# Patient Record
Sex: Female | Born: 1970 | Race: White | Hispanic: No | Smoking: Never smoker
Health system: Southern US, Community
[De-identification: ages and names within clinical notes are randomized; demographics above are authoritative.]

## PROBLEM LIST (undated history)

## (undated) DIAGNOSIS — J45909 Unspecified asthma, uncomplicated: Secondary | ICD-10-CM

## (undated) DIAGNOSIS — K589 Irritable bowel syndrome without diarrhea: Secondary | ICD-10-CM

## (undated) DIAGNOSIS — N83209 Unspecified ovarian cyst, unspecified side: Secondary | ICD-10-CM

## (undated) DIAGNOSIS — F419 Anxiety disorder, unspecified: Secondary | ICD-10-CM

## (undated) HISTORY — PX: CHOLECYSTECTOMY: SHX55

## (undated) HISTORY — PX: ERCP: SHX60

## (undated) HISTORY — PX: TUBAL LIGATION: SHX77

## (undated) HISTORY — PX: OVARIAN CYST REMOVAL: SHX89

---

## 2010-02-25 ENCOUNTER — Emergency Department (HOSPITAL_BASED_OUTPATIENT_CLINIC_OR_DEPARTMENT_OTHER): Admission: EM | Admit: 2010-02-25 | Discharge: 2010-02-25 | Payer: Self-pay | Admitting: Emergency Medicine

## 2010-02-25 ENCOUNTER — Ambulatory Visit: Payer: Self-pay | Admitting: Diagnostic Radiology

## 2012-01-18 ENCOUNTER — Emergency Department (INDEPENDENT_AMBULATORY_CARE_PROVIDER_SITE_OTHER): Payer: Self-pay

## 2012-01-18 ENCOUNTER — Encounter (HOSPITAL_BASED_OUTPATIENT_CLINIC_OR_DEPARTMENT_OTHER): Payer: Self-pay | Admitting: *Deleted

## 2012-01-18 ENCOUNTER — Other Ambulatory Visit: Payer: Self-pay

## 2012-01-18 ENCOUNTER — Emergency Department (HOSPITAL_BASED_OUTPATIENT_CLINIC_OR_DEPARTMENT_OTHER)
Admission: EM | Admit: 2012-01-18 | Discharge: 2012-01-19 | Disposition: A | Discharge status: Home / Self Care | Payer: Self-pay | Attending: Emergency Medicine | Admitting: Emergency Medicine

## 2012-01-18 DIAGNOSIS — R109 Unspecified abdominal pain: Secondary | ICD-10-CM | POA: Insufficient documentation

## 2012-01-18 DIAGNOSIS — R079 Chest pain, unspecified: Secondary | ICD-10-CM

## 2012-01-18 DIAGNOSIS — R209 Unspecified disturbances of skin sensation: Secondary | ICD-10-CM

## 2012-01-18 DIAGNOSIS — R748 Abnormal levels of other serum enzymes: Secondary | ICD-10-CM

## 2012-01-18 DIAGNOSIS — R7989 Other specified abnormal findings of blood chemistry: Secondary | ICD-10-CM | POA: Insufficient documentation

## 2012-01-18 DIAGNOSIS — R112 Nausea with vomiting, unspecified: Secondary | ICD-10-CM | POA: Insufficient documentation

## 2012-01-18 DIAGNOSIS — M549 Dorsalgia, unspecified: Secondary | ICD-10-CM

## 2012-01-18 DIAGNOSIS — R1110 Vomiting, unspecified: Secondary | ICD-10-CM

## 2012-01-18 DIAGNOSIS — R0602 Shortness of breath: Secondary | ICD-10-CM | POA: Insufficient documentation

## 2012-01-18 LAB — URINALYSIS, ROUTINE W REFLEX MICROSCOPIC
Hgb urine dipstick: NEGATIVE
Ketones, ur: 15 mg/dL — AB
Leukocytes, UA: NEGATIVE
Protein, ur: NEGATIVE mg/dL
Specific Gravity, Urine: 1.024 (ref 1.005–1.030)
Urobilinogen, UA: 0.2 mg/dL (ref 0.0–1.0)

## 2012-01-18 LAB — CBC
MCHC: 33.3 g/dL (ref 30.0–36.0)
MCV: 83.6 fL (ref 78.0–100.0)
Platelets: 226 10*3/uL (ref 150–400)
RBC: 5.24 MIL/uL — ABNORMAL HIGH (ref 3.87–5.11)
RDW: 13 % (ref 11.5–15.5)
WBC: 15.5 10*3/uL — ABNORMAL HIGH (ref 4.0–10.5)

## 2012-01-18 LAB — DIFFERENTIAL
Basophils Absolute: 0 10*3/uL (ref 0.0–0.1)
Basophils Relative: 0 % (ref 0–1)
Eosinophils Absolute: 0 10*3/uL (ref 0.0–0.7)
Eosinophils Relative: 0 % (ref 0–5)
Neutrophils Relative %: 94 % — ABNORMAL HIGH (ref 43–77)

## 2012-01-18 LAB — COMPREHENSIVE METABOLIC PANEL
ALT: 251 U/L — ABNORMAL HIGH (ref 0–35)
AST: 412 U/L — ABNORMAL HIGH (ref 0–37)
CO2: 24 mEq/L (ref 19–32)
Calcium: 9.2 mg/dL (ref 8.4–10.5)
Chloride: 103 mEq/L (ref 96–112)
GFR calc Af Amer: >90 mL/min (ref >90)
GFR calc non Af Amer: >90 mL/min (ref >90)
Potassium: 3.8 mEq/L (ref 3.5–5.1)
Total Bilirubin: 0.8 mg/dL (ref 0.3–1.2)

## 2012-01-18 LAB — CARDIAC PANEL(CRET KIN+CKTOT+MB+TROPI): Relative Index: INVALID (ref 0.0–2.5)

## 2012-01-18 LAB — LIPASE, BLOOD: Lipase: 25 U/L (ref 11–59)

## 2012-01-18 MED ORDER — SODIUM CHLORIDE 0.9 % IV BOLUS (SEPSIS)
1000.0000 mL | Freq: Once | INTRAVENOUS | Status: AC
  Administered 2012-01-18: 1000 mL via INTRAVENOUS

## 2012-01-18 MED ORDER — ONDANSETRON HCL 4 MG/2ML IJ SOLN
4.0000 mg | Freq: Once | INTRAMUSCULAR | Status: AC
  Administered 2012-01-18: 4 mg via INTRAVENOUS
  Filled 2012-01-18: qty 2

## 2012-01-18 MED ORDER — MORPHINE SULFATE 4 MG/ML IJ SOLN
4.0000 mg | Freq: Once | INTRAMUSCULAR | Status: AC
  Administered 2012-01-18: 4 mg via INTRAVENOUS
  Filled 2012-01-18: qty 1

## 2012-01-18 NOTE — ED Notes (Signed)
Pt presents to ED today with cold sx.  Pt reports vomitting x1 about 1 hour ago with associated SHOB and numbness

## 2012-01-18 NOTE — ED Notes (Signed)
Family at bedside. No active vomiting.

## 2012-01-18 NOTE — ED Provider Notes (Signed)
History     CSN: [id number]  Arrival date & time 01/18/12  2108   First MD Initiated Contact with Patient 01/18/12 2202      Chief Complaint  Patient presents with  . Emesis  . URI   patient was recently out of town in [place] , visiting her grandmother in the hospital.. Today after work she began vomiting acutely. States she vomited 3 times. She's also had some cold and URI symptoms. She's had no diarrhea. Shortly after the vomiting. She began to feel diffuse epigastric pain and shortness of breath, especially when she tried to breathe deeper moved. She states the epigastric pain is worse especially if she tries to move around at all. The vomiting has subsided. She also states there was some "numbness" in her arms earlier today. Apparently, has no significant past medical history other than childhood asthma.  Denies any fever or any urinary symptoms. She has had some back pain.  (Consider location/radiation/quality/duration/timing/severity/associated sxs/prior treatment) HPI  History reviewed. No pertinent past medical history.  History reviewed. No pertinent past surgical history.  History reviewed. No pertinent family history.  History  Substance Use Topics  . Smoking status: Not on file  . Smokeless tobacco: Not on file  . Alcohol Use:     OB History    Grav Para Term Preterm Abortions TAB SAB Ect Mult Living                  Review of Systems  All other systems reviewed and are negative.    Allergies  Review of patient's allergies indicates no known allergies.  Home Medications   Current Outpatient Rx  Name Route Sig Dispense Refill  . PRESCRIPTION MEDICATION Oral Take 1 tablet by mouth 3 (three) times daily. Tone ([employer] Vitamin)      BP 128/80  Pulse 88  Temp(Src) 98.3 F (36.8 C) (Oral)  Resp 20  SpO2 99%  Physical Exam  Nursing note and vitals reviewed. Constitutional: She is oriented to person, place, and time. She appears well-developed and  well-nourished.  HENT:  Head: Normocephalic and atraumatic.  Eyes: Conjunctivae and EOM are normal. Pupils are equal, round, and reactive to light.  Neck: Neck supple.  Cardiovascular: Normal rate and regular rhythm.  Exam reveals no gallop and no friction rub.   No murmur heard. Pulmonary/Chest: Effort normal and breath sounds normal. No respiratory distress. She has no wheezes. She has no rales. She exhibits no tenderness.  Abdominal: Soft. Bowel sounds are normal. She exhibits no distension. There is no tenderness. There is no rebound and no guarding.       Very minimal epigastric tenderness. Otherwise, negative.  Musculoskeletal: Normal range of motion.  Neurological: She is alert and oriented to person, place, and time. No cranial nerve deficit. Coordination normal.  Skin: Skin is warm and dry. No rash noted.  Psychiatric: She has a normal mood and affect.    ED Course  Procedures (including critical care time)   Labs Reviewed  CARDIAC PANEL(CRET KIN+CKTOT+MB+TROPI)  CBC  DIFFERENTIAL  COMPREHENSIVE METABOLIC PANEL  LIPASE, BLOOD  PREGNANCY, URINE  URINALYSIS, ROUTINE W REFLEX MICROSCOPIC   No results found.   No diagnosis found.    MDM  Pt is seen and examined;  Initial history and physical completed.  Will follow.        Results for orders placed during the hospital encounter of 01/18/12  CARDIAC PANEL(CRET KIN+CKTOT+MB+TROPI)      Component Value Range  Total CK 68  7 - 177 (U/L)   CK, MB 1.6  0.3 - 4.0 (ng/mL)   Troponin I <0.30  <0.30 (ng/mL)   Relative Index RELATIVE INDEX IS INVALID  0.0 - 2.5   CBC      Component Value Range   WBC 15.5 (*) 4.0 - 10.5 (K/uL)   RBC 5.24 (*) 3.87 - 5.11 (MIL/uL)   Hemoglobin 14.6  12.0 - 15.0 (g/dL)   HCT 40.9  81.1 - 91.4 (%)   MCV 83.6  78.0 - 100.0 (fL)   MCH 27.9  26.0 - 34.0 (pg)   MCHC 33.3  30.0 - 36.0 (g/dL)   RDW 78.2  95.6 - 21.3 (%)   Platelets 226  150 - 400 (K/uL)  DIFFERENTIAL      Component  Value Range   Neutrophils Relative 94 (*) 43 - 77 (%)   Neutro Abs 14.5 (*) 1.7 - 7.7 (K/uL)   Lymphocytes Relative 3 (*) 12 - 46 (%)   Lymphs Abs 0.5 (*) 0.7 - 4.0 (K/uL)   Monocytes Relative 3  3 - 12 (%)   Monocytes Absolute 0.5  0.1 - 1.0 (K/uL)   Eosinophils Relative 0  0 - 5 (%)   Eosinophils Absolute 0.0  0.0 - 0.7 (K/uL)   Basophils Relative 0  0 - 1 (%)   Basophils Absolute 0.0  0.0 - 0.1 (K/uL)  COMPREHENSIVE METABOLIC PANEL      Component Value Range   Sodium 138  135 - 145 (mEq/L)   Potassium 3.8  3.5 - 5.1 (mEq/L)   Chloride 103  96 - 112 (mEq/L)   CO2 24  19 - 32 (mEq/L)   Glucose, Bld 137 (*) 70 - 99 (mg/dL)   BUN 16  6 - 23 (mg/dL)   Creatinine, Ser 0.86  0.50 - 1.10 (mg/dL)   Calcium 9.2  8.4 - 57.8 (mg/dL)   Total Protein 7.4  6.0 - 8.3 (g/dL)   Albumin 4.0  3.5 - 5.2 (g/dL)   AST 469 (*) 0 - 37 (U/L)   ALT 251 (*) 0 - 35 (U/L)   Alkaline Phosphatase 75  39 - 117 (U/L)   Total Bilirubin 0.8  0.3 - 1.2 (mg/dL)   GFR calc non Af Amer >90  >90 (mL/min)   GFR calc Af Amer >90  >90 (mL/min)  LIPASE, BLOOD      Component Value Range   Lipase 25  11 - 59 (U/L)  PREGNANCY, URINE      Component Value Range   Preg Test, Ur NEGATIVE    URINALYSIS, ROUTINE W REFLEX MICROSCOPIC      Component Value Range   Color, Urine YELLOW  YELLOW    APPearance CLOUDY (*) CLEAR    Specific Gravity, Urine 1.024  1.005 - 1.030    pH 7.5  5.0 - 8.0    Glucose, UA NEGATIVE  NEGATIVE (mg/dL)   Hgb urine dipstick NEGATIVE  NEGATIVE    Bilirubin Urine NEGATIVE  NEGATIVE    Ketones, ur 15 (*) NEGATIVE (mg/dL)   Protein, ur NEGATIVE  NEGATIVE (mg/dL)   Urobilinogen, UA 0.2  0.0 - 1.0 (mg/dL)   Nitrite NEGATIVE  NEGATIVE    Leukocytes, UA NEGATIVE  NEGATIVE    Dg Chest 2 View  01/18/2012  *RADIOLOGY REPORT*  Clinical Data: Shortness of breath, bilateral chest and back pain, bilateral hand numbness and vomiting.  CHEST - 2 VIEW  Comparison: None.  Findings: The lungs are well-aerated  and clear.  There is no evidence of focal opacification, pleural effusion or pneumothorax.  The heart is normal in size; the mediastinal contour is within normal limits.  No acute osseous abnormalities are seen.  Clips are noted within the right upper quadrant, reflecting prior cholecystectomy.  IMPRESSION: No acute cardiopulmonary process seen.  Original Report Authenticated By: Tonia Ghent, M.D.     Date: 01/18/2012  Rate: 77  Rhythm: normal sinus rhythm  QRS Axis: normal  Intervals: normal  ST/T Wave abnormalities: normal  Conduction Disutrbances:Incomplete RBBB  Narrative Interpretation:   Old EKG Reviewed: none available  11:58 PM Patient is reassessed in the room. Lab studies, discussed, including elevated, AST and ALT. Patient, states she's had a cholecystectomy done years ago. Her lipase is normal. Urine showed ketones, but negative for nitrites or leukocytes. A slightly elevated white blood cell count noted. Chest x-ray is normal. EKG, documented above. No significant changes or abnormalities noted.   Plan will be to obtain a CT scan to further evaluate the persistent epigastric pain and elevated liver function tests. The overnight physician will follow care of the patient                        Pending appropriate disposition. Remains stable upon discharge.    Dymphna Wadley A. Patrica Duel, MD 01/18/12 2359

## 2012-01-19 ENCOUNTER — Emergency Department (INDEPENDENT_AMBULATORY_CARE_PROVIDER_SITE_OTHER): Payer: Self-pay

## 2012-01-19 DIAGNOSIS — R112 Nausea with vomiting, unspecified: Secondary | ICD-10-CM

## 2012-01-19 DIAGNOSIS — R7989 Other specified abnormal findings of blood chemistry: Secondary | ICD-10-CM

## 2012-01-19 DIAGNOSIS — K5730 Diverticulosis of large intestine without perforation or abscess without bleeding: Secondary | ICD-10-CM

## 2012-01-19 DIAGNOSIS — R109 Unspecified abdominal pain: Secondary | ICD-10-CM

## 2012-01-19 MED ORDER — OXYCODONE-ACETAMINOPHEN 5-325 MG PO TABS: 1.0000 | ORAL_TABLET | ORAL | Status: AC | PRN

## 2012-01-19 MED ORDER — OXYCODONE-ACETAMINOPHEN 5-325 MG PO TABS
1.0000 | ORAL_TABLET | Freq: Once | ORAL | Status: AC
  Administered 2012-01-19: 1 via ORAL
  Filled 2012-01-19: qty 1

## 2012-01-19 MED ORDER — ONDANSETRON HCL 4 MG/2ML IJ SOLN
4.0000 mg | Freq: Once | INTRAMUSCULAR | Status: AC
  Administered 2012-01-19: 4 mg via INTRAVENOUS
  Filled 2012-01-19: qty 2

## 2012-01-19 MED ORDER — ONDANSETRON 4 MG PO TBDP: 4.0000 mg | ORAL_TABLET | Freq: Three times a day (TID) | ORAL | Status: AC | PRN

## 2012-01-19 NOTE — ED Provider Notes (Signed)
Results for orders placed during the hospital encounter of 01/18/12  CARDIAC PANEL(CRET KIN+CKTOT+MB+TROPI)      Component Value Range   Total CK 68  7 - 177 (U/L)   CK, MB 1.6  0.3 - 4.0 (ng/mL)   Troponin I <0.30  <0.30 (ng/mL)   Relative Index RELATIVE INDEX IS INVALID  0.0 - 2.5   CBC      Component Value Range   WBC 15.5 (*) 4.0 - 10.5 (K/uL)   RBC 5.24 (*) 3.87 - 5.11 (MIL/uL)   Hemoglobin 14.6  12.0 - 15.0 (g/dL)   HCT 40.9  81.1 - 91.4 (%)   MCV 83.6  78.0 - 100.0 (fL)   MCH 27.9  26.0 - 34.0 (pg)   MCHC 33.3  30.0 - 36.0 (g/dL)   RDW 78.2  95.6 - 21.3 (%)   Platelets 226  150 - 400 (K/uL)  DIFFERENTIAL      Component Value Range   Neutrophils Relative 94 (*) 43 - 77 (%)   Neutro Abs 14.5 (*) 1.7 - 7.7 (K/uL)   Lymphocytes Relative 3 (*) 12 - 46 (%)   Lymphs Abs 0.5 (*) 0.7 - 4.0 (K/uL)   Monocytes Relative 3  3 - 12 (%)   Monocytes Absolute 0.5  0.1 - 1.0 (K/uL)   Eosinophils Relative 0  0 - 5 (%)   Eosinophils Absolute 0.0  0.0 - 0.7 (K/uL)   Basophils Relative 0  0 - 1 (%)   Basophils Absolute 0.0  0.0 - 0.1 (K/uL)  COMPREHENSIVE METABOLIC PANEL      Component Value Range   Sodium 138  135 - 145 (mEq/L)   Potassium 3.8  3.5 - 5.1 (mEq/L)   Chloride 103  96 - 112 (mEq/L)   CO2 24  19 - 32 (mEq/L)   Glucose, Bld 137 (*) 70 - 99 (mg/dL)   BUN 16  6 - 23 (mg/dL)   Creatinine, Ser 0.86  0.50 - 1.10 (mg/dL)   Calcium 9.2  8.4 - 57.8 (mg/dL)   Total Protein 7.4  6.0 - 8.3 (g/dL)   Albumin 4.0  3.5 - 5.2 (g/dL)   AST 469 (*) 0 - 37 (U/L)   ALT 251 (*) 0 - 35 (U/L)   Alkaline Phosphatase 75  39 - 117 (U/L)   Total Bilirubin 0.8  0.3 - 1.2 (mg/dL)   GFR calc non Af Amer >90  >90 (mL/min)   GFR calc Af Amer >90  >90 (mL/min)  LIPASE, BLOOD      Component Value Range   Lipase 25  11 - 59 (U/L)  PREGNANCY, URINE      Component Value Range   Preg Test, Ur NEGATIVE    URINALYSIS, ROUTINE W REFLEX MICROSCOPIC      Component Value Range   Color, Urine YELLOW  YELLOW     APPearance CLOUDY (*) CLEAR    Specific Gravity, Urine 1.024  1.005 - 1.030    pH 7.5  5.0 - 8.0    Glucose, UA NEGATIVE  NEGATIVE (mg/dL)   Hgb urine dipstick NEGATIVE  NEGATIVE    Bilirubin Urine NEGATIVE  NEGATIVE    Ketones, ur 15 (*) NEGATIVE (mg/dL)   Protein, ur NEGATIVE  NEGATIVE (mg/dL)   Urobilinogen, UA 0.2  0.0 - 1.0 (mg/dL)   Nitrite NEGATIVE  NEGATIVE    Leukocytes, UA NEGATIVE  NEGATIVE   D-DIMER, QUANTITATIVE      Component Value Range   D-Dimer, Quant 1.05 (*)  0.00 - 0.48 (ug/mL-FEU)   Ct Abdomen Pelvis Wo Contrast  01/19/2012  *RADIOLOGY REPORT*  Clinical Data: Abdominal pain, nausea and vomiting; elevated LFTs.  CT ABDOMEN AND PELVIS WITHOUT CONTRAST  Technique:  Multidetector CT imaging of the abdomen and pelvis was performed following the standard protocol without intravenous contrast.  Comparison: None.  Findings: Minimal bibasilar scarring is noted; the visualized lung bases are otherwise clear.  The liver and spleen are unremarkable in appearance.  The patient is status post cholecystectomy, with clips noted at the gallbladder fossa.  The pancreas and adrenal glands are unremarkable.  The kidneys are unremarkable in appearance.  There is no evidence of hydronephrosis.  No renal or ureteral stones are seen.  No perinephric stranding is appreciated.  No free fluid is identified.  The small bowel is unremarkable in appearance.  The stomach is within normal limits.  No acute vascular abnormalities are seen.  The appendix there is normal in caliber, without evidence for appendicitis.  Scattered diverticulosis is noted along the descending colon and at the proximal transverse colon, without evidence of diverticulitis.  The colon is otherwise unremarkable in appearance.  The bladder is mildly distended; the attenuation of fluid in the bladder is increased.  The uterus is grossly unremarkable in appearance.  The ovaries are relatively symmetric; no suspicious adnexal masses are  seen.  No inguinal lymphadenopathy is seen.  No acute osseous abnormalities are identified.  IMPRESSION:  1.  Increased attenuation of the fluid within the bladder; this is of uncertain etiology, as the patient's urinalysis is not particularly impressive. 2.  Scattered diverticulosis along the descending colon and at the proximal transverse colon, without evidence of diverticulitis.  Original Report Authenticated By: Tonia Ghent, M.D.   Dg Chest 2 View  01/18/2012  *RADIOLOGY REPORT*  Clinical Data: Shortness of breath, bilateral chest and back pain, bilateral hand numbness and vomiting.  CHEST - 2 VIEW  Comparison: None.  Findings: The lungs are well-aerated and clear.  There is no evidence of focal opacification, pleural effusion or pneumothorax.  The heart is normal in size; the mediastinal contour is within normal limits.  No acute osseous abnormalities are seen.  Clips are noted within the right upper quadrant, reflecting prior cholecystectomy.  IMPRESSION: No acute cardiopulmonary process seen.  Original Report Authenticated By: Tonia Ghent, M.D.    Patient was turned over to me by Dr. Patrica Duel pending a CT of her abdomen and pelvis.  The CT of her abdomen pelvis does not show any significant abnormalities to explain the patient's elevated liver tests as well as her upper abdominal pain and nausea and vomiting.  I have gone back and reassessed this patient and she has a soft benign abdomen it does not appear to have an acute process at this time.  Patient has already had a cholecystectomy which makes a gallstone unlikely.  Patient's nausea is now improved and patient notes pain only when she moves certain ways.  She has no difficulty breathing at this time.  Her lungs are clear on exam as well.  I noted that Dr. Patrica Duel did order a d-dimer on this patient with pulmonary embolus seems unlikely given the description of this patient's symptoms.  Patient notes that she only feels short of breath when she  has worsening of her pain and her pain is worsened with movement and is bilateral in her upper abdomen.  Patient has no noted leg swelling.  Notably by [facility] criteria patient would also be negative.  Therefore as the d-dimer is a nonspecific test I do not feel this patient needs further evaluation with a CT angio for evaluation of pulmonary embolus.  Patient's symptoms could potentially be explained by gastritis or hiatal hernia causing irritation of the diaphragm.  Patient has been advised to use her pain medication and nausea medicine that I will send her home with as well as acid reflux medicine to treat her symptoms.  She is also been advised to followup with her primary care physician at [facility] this week for further follow not only for her symptoms but for her elevated liver tests.  Patient does give a history of elevated liver enzymes in the past although I have no prior tests to compare to today's results 2.  It is unclear if the elevated liver enzymes today are part of this acute process.  As patient has had improvement in her symptoms and has a benign exam with normal vital signs I feel this patient can be safely discharged home with close followup with her PCP.  She's also been given strict precautions to return for worsening pain, difficulty breathing persistent vomiting or other concerns and her and her husband both understand this at time of discharge.  Nat Christen, MD 01/19/12 [phone]

## 2012-05-27 ENCOUNTER — Encounter (HOSPITAL_BASED_OUTPATIENT_CLINIC_OR_DEPARTMENT_OTHER): Payer: Self-pay | Admitting: Emergency Medicine

## 2012-05-27 ENCOUNTER — Emergency Department (HOSPITAL_BASED_OUTPATIENT_CLINIC_OR_DEPARTMENT_OTHER)
Admission: EM | Admit: 2012-05-27 | Discharge: 2012-05-27 | Disposition: A | Discharge status: Home / Self Care | Payer: No Typology Code available for payment source | Attending: Emergency Medicine | Admitting: Emergency Medicine

## 2012-05-27 ENCOUNTER — Emergency Department (HOSPITAL_BASED_OUTPATIENT_CLINIC_OR_DEPARTMENT_OTHER): Payer: No Typology Code available for payment source

## 2012-05-27 DIAGNOSIS — S239XXA Sprain of unspecified parts of thorax, initial encounter: Secondary | ICD-10-CM | POA: Insufficient documentation

## 2012-05-27 DIAGNOSIS — R000 Tachycardia, unspecified: Secondary | ICD-10-CM | POA: Insufficient documentation

## 2012-05-27 DIAGNOSIS — S29019A Strain of muscle and tendon of unspecified wall of thorax, initial encounter: Secondary | ICD-10-CM

## 2012-05-27 DIAGNOSIS — S20219A Contusion of unspecified front wall of thorax, initial encounter: Secondary | ICD-10-CM

## 2012-05-27 DIAGNOSIS — S139XXA Sprain of joints and ligaments of unspecified parts of neck, initial encounter: Secondary | ICD-10-CM | POA: Insufficient documentation

## 2012-05-27 DIAGNOSIS — S161XXA Strain of muscle, fascia and tendon at neck level, initial encounter: Secondary | ICD-10-CM

## 2012-05-27 MED ORDER — HYDROCODONE-ACETAMINOPHEN 5-325 MG PO TABS: 1.0000 | ORAL_TABLET | ORAL | Status: AC | PRN

## 2012-05-27 MED ORDER — IBUPROFEN 400 MG PO TABS
600.0000 mg | ORAL_TABLET | Freq: Once | ORAL | Status: AC
  Administered 2012-05-27: 600 mg via ORAL
  Filled 2012-05-27: qty 1
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
## 2012-05-27 NOTE — ED Notes (Signed)
Pt transported to and from radiology. 
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
## 2012-10-13 ENCOUNTER — Emergency Department (HOSPITAL_BASED_OUTPATIENT_CLINIC_OR_DEPARTMENT_OTHER)
Admission: EM | Admit: 2012-10-13 | Discharge: 2012-10-13 | Disposition: A | Discharge status: Home / Self Care | Payer: Self-pay | Attending: Emergency Medicine | Admitting: Emergency Medicine

## 2012-10-13 ENCOUNTER — Encounter (HOSPITAL_BASED_OUTPATIENT_CLINIC_OR_DEPARTMENT_OTHER): Payer: Self-pay | Admitting: *Deleted

## 2012-10-13 DIAGNOSIS — N898 Other specified noninflammatory disorders of vagina: Secondary | ICD-10-CM | POA: Insufficient documentation

## 2012-10-13 DIAGNOSIS — N939 Abnormal uterine and vaginal bleeding, unspecified: Secondary | ICD-10-CM

## 2012-10-13 HISTORY — DX: Unspecified asthma, uncomplicated: J45.909

## 2012-10-13 HISTORY — DX: Anxiety disorder, unspecified: F41.9

## 2012-10-13 HISTORY — DX: Unspecified ovarian cyst, unspecified side: N83.209

## 2012-10-13 LAB — WET PREP, GENITAL
Clue Cells Wet Prep HPF POC: NONE SEEN
Yeast Wet Prep HPF POC: NONE SEEN

## 2012-10-13 LAB — URINALYSIS, ROUTINE W REFLEX MICROSCOPIC
Glucose, UA: NEGATIVE mg/dL
Ketones, ur: NEGATIVE mg/dL
Leukocytes, UA: NEGATIVE
Nitrite: NEGATIVE
Protein, ur: NEGATIVE mg/dL
pH: 5.5 (ref 5.0–8.0)

## 2012-10-13 LAB — HEMOGLOBIN AND HEMATOCRIT, BLOOD: HCT: 35.9 % — ABNORMAL LOW (ref 36.0–46.0)

## 2012-10-13 LAB — PREGNANCY, URINE: Preg Test, Ur: NEGATIVE

## 2012-10-13 MED ORDER — FERROUS SULFATE 325 (65 FE) MG PO TABS: 325.0000 mg | ORAL_TABLET | Freq: Every day | ORAL | Status: DC

## 2012-10-13 NOTE — ED Provider Notes (Signed)
History     CSN: [id number]  Arrival date & time 10/13/12  1231   First MD Initiated Contact with Patient 10/13/12 1242      Chief Complaint  Patient presents with  . Vaginal Bleeding    (Consider location/radiation/quality/duration/timing/severity/associated sxs/prior treatment) HPI Comments: 41 year old female presents emergency department with worsening vaginal bleeding. 5 years ago she had painful intercourse with her husband, went to the bathroom and noticed thick brown streaks in her urine. The next day she woke up and urinated and noticed thick brown blood clots in her urine. She went to see her primary care physician who sent her to see her OB/GYN. She saw her OB/GYN Dr. Christella Hartigan at [facility] 2 days ago. The vaginal bleeding is only present when she urinates. Dr. Christella Hartigan did a transvaginal ultrasound which showed uterine fibroids. She is scheduled for a D&C next Friday. Admits to associated lower abdominal and pelvic discomfort. The discomfort is constant, worse when she urinates. States her urine has been cloudy and very yellow. 45 minutes prior to arriving in the emergency department today, she urinated and noticed "bright pink chunks" in the toilet and states they were huge. Denies fever, chills, nausea or vomiting. She normally gets her menstrual period at the beginning of every month, however this month she was weak, and never officially had her cycle. She had a tubal ligation 8 years ago.  Patient is a 40 y.o. female presenting with vaginal bleeding. The history is provided by the patient.  Vaginal Bleeding Associated symptoms include abdominal pain and fatigue. Pertinent negatives include no chest pain, chills, fever, nausea, neck pain, rash, vomiting or weakness.    Past Medical History  Diagnosis Date  . Ovarian cyst   . Anxiety   . Asthma     Past Surgical History  Procedure Date  . Cholecystectomy   . Cesarean section   . Ercp   . Ovarian cyst removal   . Ercp       No family history on file.  History  Substance Use Topics  . Smoking status: Never Smoker   . Smokeless tobacco: Not on file  . Alcohol Use: Yes     occasional    OB History    Grav Para Term Preterm Abortions TAB SAB Ect Mult Living                  Review of Systems  Constitutional: Positive for fatigue. Negative for fever and chills.  HENT: Negative for neck pain and neck stiffness.   Respiratory: Negative for shortness of breath.   Cardiovascular: Negative for chest pain.  Gastrointestinal: Positive for abdominal pain. Negative for nausea and vomiting.  Genitourinary: Positive for vaginal bleeding, vaginal discharge, vaginal pain and pelvic pain.  Musculoskeletal: Positive for back pain.  Skin: Negative for color change and rash.  Neurological: Negative for weakness.  Psychiatric/Behavioral: The patient is nervous/anxious.     Allergies  Ivp dye  Home Medications   Current Outpatient Rx  Name Route Sig Dispense Refill  . AMOXICILLIN-POT CLAVULANATE 875-125 MG PO TABS Oral Take 1 tablet by mouth 2 (two) times daily.    Marland Kitchen NAPROXEN SODIUM 550 MG PO TABS Oral Take 550 mg by mouth 2 (two) times daily with a meal.    . PAROXETINE HCL 30 MG PO TABS Oral Take 30 mg by mouth every morning.    . ADULT MULTIVITAMIN W/MINERALS CH Oral Take 1 tablet by mouth daily.    Marland Kitchen OVER THE COUNTER MEDICATION  Oral Take 2 tablets by mouth daily. Slim Trim You supplement      BP 152/86  Pulse 89  Temp 98.7 F (37.1 C) (Oral)  Resp 18  Ht 5\' 5"  (1.651 m)  Wt 149 lb (67.586 kg)  BMI 24.79 kg/m2  SpO2 100%  LMP 09/13/2012  Physical Exam  Constitutional: She is oriented to person, place, and time. She appears well-developed and well-nourished. No distress.  HENT:  Head: Normocephalic and atraumatic.  Mouth/Throat: Oropharynx is clear and moist.  Eyes: Conjunctivae normal and EOM are normal.  Neck: Normal range of motion. Neck supple.  Cardiovascular: Normal rate, regular  rhythm and normal heart sounds.   Pulmonary/Chest: Effort normal and breath sounds normal.  Abdominal: Soft. Normal appearance and bowel sounds are normal. She exhibits no distension and no mass. There is generalized tenderness ("discomfort" with palpation). There is CVA tenderness (bilaterally).  Genitourinary: Uterus is tender. Cervix exhibits discharge (thick, copious, clear/yellow/bloody). Cervix exhibits no motion tenderness and no friability. Right adnexum displays tenderness. Right adnexum displays no mass and no fullness. Left adnexum displays tenderness. Left adnexum displays no mass and no fullness. There is tenderness and bleeding around the vagina. No erythema around the vagina. Vaginal discharge (thick, clear/yellow/bloody, copious) found.  Musculoskeletal: Normal range of motion. She exhibits no edema.  Neurological: She is alert and oriented to person, place, and time.  Psychiatric: Her speech is normal and behavior is normal.    ED Course  Procedures (including critical care time)   Labs Reviewed  URINALYSIS, ROUTINE W REFLEX MICROSCOPIC  URINE CULTURE  PREGNANCY, URINE  WET PREP, GENITAL   Results for orders placed during the hospital encounter of 10/13/12  URINALYSIS, ROUTINE W REFLEX MICROSCOPIC      Component Value Range   Color, Urine YELLOW  YELLOW   APPearance CLEAR  CLEAR   Specific Gravity, Urine 1.031 (*) 1.005 - 1.030   pH 5.5  5.0 - 8.0   Glucose, UA NEGATIVE  NEGATIVE mg/dL   Hgb urine dipstick NEGATIVE  NEGATIVE   Bilirubin Urine NEGATIVE  NEGATIVE   Ketones, ur NEGATIVE  NEGATIVE mg/dL   Protein, ur NEGATIVE  NEGATIVE mg/dL   Urobilinogen, UA 1.0  0.0 - 1.0 mg/dL   Nitrite NEGATIVE  NEGATIVE   Leukocytes, UA NEGATIVE  NEGATIVE  PREGNANCY, URINE      Component Value Range   Preg Test, Ur NEGATIVE  NEGATIVE  WET PREP, GENITAL      Component Value Range   Yeast Wet Prep HPF POC NONE SEEN  NONE SEEN   Trich, Wet Prep NONE SEEN  NONE SEEN   Clue  Cells Wet Prep HPF POC NONE SEEN  NONE SEEN   WBC, Wet Prep HPF POC TOO NUMEROUS TO COUNT (*) NONE SEEN    No results found.   1. Vaginal bleeding       MDM  41 year old female presenting to the emergency department with worsening vaginal bleeding since seeing Dr. Christella Hartigan 2 days ago. I spoke with Dr. Christella Hartigan who mentioned he prescribed her Lystetta for the heavy vaginal bleeding, however she did not start taking this yet. He has not had any for an additional pelvic ultrasound, and advised for me to tell her to keep her procedure scheduled for next Friday. I will prescribe her iron supplementation due to vaginal bleeding.      Trevor Mace, PA-C 10/13/12 1437

## 2012-10-13 NOTE — ED Notes (Signed)
Patient states she was seen by her OBGYN for lower abdominal pain and painful intercourse for the last 5 days.  Was given a transvaginal ultrasound, pelvic exam and a pap smear 2 days ago by [facility].  States she has had any bleeding, but notices brown drainage when she urinates and wipes.  Approximately 45 minutes pta, she was went to urinate, and passed a large amount of pink tissue, size of the palm of her hand.  Mild lower abdominal cramping.  States she is on pain meds and an antiobiotic per her OBGYN and is scheduled for a d&n next Friday at [facility].

## 2012-10-13 NOTE — ED Provider Notes (Signed)
Medical screening examination/treatment/procedure(s) were performed by non-physician practitioner and as supervising physician I was immediately available for consultation/collaboration.  Jones Skene, M.D.     Jones Skene, MD 10/13/12 1652

## 2012-10-15 LAB — URINE CULTURE: Colony Count: NO GROWTH

## 2012-10-16 LAB — GC/CHLAMYDIA PROBE AMP, GENITAL: GC Probe Amp, Genital: NEGATIVE

## 2014-01-09 ENCOUNTER — Encounter (HOSPITAL_BASED_OUTPATIENT_CLINIC_OR_DEPARTMENT_OTHER): Payer: Self-pay | Admitting: Emergency Medicine

## 2014-01-09 ENCOUNTER — Emergency Department (HOSPITAL_BASED_OUTPATIENT_CLINIC_OR_DEPARTMENT_OTHER): Payer: No Typology Code available for payment source

## 2014-01-09 ENCOUNTER — Emergency Department (HOSPITAL_BASED_OUTPATIENT_CLINIC_OR_DEPARTMENT_OTHER)
Admission: EM | Admit: 2014-01-09 | Discharge: 2014-01-09 | Disposition: A | Discharge status: Home / Self Care | Payer: No Typology Code available for payment source | Attending: Emergency Medicine | Admitting: Emergency Medicine

## 2014-01-09 DIAGNOSIS — Z792 Long term (current) use of antibiotics: Secondary | ICD-10-CM | POA: Insufficient documentation

## 2014-01-09 DIAGNOSIS — Z79899 Other long term (current) drug therapy: Secondary | ICD-10-CM | POA: Insufficient documentation

## 2014-01-09 DIAGNOSIS — M549 Dorsalgia, unspecified: Secondary | ICD-10-CM | POA: Insufficient documentation

## 2014-01-09 DIAGNOSIS — K5900 Constipation, unspecified: Secondary | ICD-10-CM | POA: Insufficient documentation

## 2014-01-09 DIAGNOSIS — Z3202 Encounter for pregnancy test, result negative: Secondary | ICD-10-CM | POA: Insufficient documentation

## 2014-01-09 DIAGNOSIS — J45909 Unspecified asthma, uncomplicated: Secondary | ICD-10-CM | POA: Insufficient documentation

## 2014-01-09 DIAGNOSIS — Z791 Long term (current) use of non-steroidal anti-inflammatories (NSAID): Secondary | ICD-10-CM | POA: Insufficient documentation

## 2014-01-09 DIAGNOSIS — Z8742 Personal history of other diseases of the female genital tract: Secondary | ICD-10-CM | POA: Insufficient documentation

## 2014-01-09 DIAGNOSIS — F411 Generalized anxiety disorder: Secondary | ICD-10-CM | POA: Insufficient documentation

## 2014-01-09 DIAGNOSIS — Z9089 Acquired absence of other organs: Secondary | ICD-10-CM | POA: Insufficient documentation

## 2014-01-09 LAB — COMPREHENSIVE METABOLIC PANEL
ALBUMIN: 4 g/dL (ref 3.5–5.2)
ALT: 15 U/L (ref 0–35)
AST: 21 U/L (ref 0–37)
Alkaline Phosphatase: 41 U/L (ref 39–117)
BUN: 15 mg/dL (ref 6–23)
CALCIUM: 9.4 mg/dL (ref 8.4–10.5)
CO2: 26 mEq/L (ref 19–32)
CREATININE: 0.8 mg/dL (ref 0.50–1.10)
Chloride: 104 mEq/L (ref 96–112)
GFR calc Af Amer: >90 mL/min (ref >90)
GFR calc non Af Amer: >90 mL/min (ref >90)
Glucose, Bld: 95 mg/dL (ref 70–99)
Potassium: 4 mEq/L (ref 3.7–5.3)
Sodium: 141 mEq/L (ref 137–147)
Total Bilirubin: 0.4 mg/dL (ref 0.3–1.2)
Total Protein: 7.5 g/dL (ref 6.0–8.3)

## 2014-01-09 LAB — URINALYSIS, ROUTINE W REFLEX MICROSCOPIC
Bilirubin Urine: NEGATIVE
Glucose, UA: NEGATIVE mg/dL
Hgb urine dipstick: NEGATIVE
Ketones, ur: 15 mg/dL — AB
LEUKOCYTES UA: NEGATIVE
NITRITE: NEGATIVE
PROTEIN: NEGATIVE mg/dL
Specific Gravity, Urine: 1.025 (ref 1.005–1.030)
UROBILINOGEN UA: 1 mg/dL (ref 0.0–1.0)
pH: 6.5 (ref 5.0–8.0)

## 2014-01-09 LAB — CBC
HCT: 41.1 % (ref 36.0–46.0)
HEMOGLOBIN: 13.8 g/dL (ref 12.0–15.0)
MCH: 29.1 pg (ref 26.0–34.0)
MCHC: 33.6 g/dL (ref 30.0–36.0)
MCV: 86.7 fL (ref 78.0–100.0)
Platelets: 223 10*3/uL (ref 150–400)
RBC: 4.74 MIL/uL (ref 3.87–5.11)
RDW: 12.7 % (ref 11.5–15.5)
WBC: 3.9 10*3/uL — ABNORMAL LOW (ref 4.0–10.5)

## 2014-01-09 LAB — PREGNANCY, URINE: PREG TEST UR: NEGATIVE

## 2014-01-09 LAB — LIPASE, BLOOD: LIPASE: 33 U/L (ref 11–59)

## 2014-01-09 NOTE — ED Notes (Signed)
Patient states that she hasn't had a BM since appx 2 weeks ago. Has been taking miralax without any relief.

## 2014-01-09 NOTE — ED Provider Notes (Signed)
CSN: [id number]     Arrival date & time 01/09/14  1109 History   First MD Initiated Contact with Patient 01/09/14 1125     Chief Complaint  Patient presents with  . Constipation   (Consider location/radiation/quality/duration/timing/severity/associated sxs/prior Treatment) HPI Comments: Also having RUQ pain similar to prior episode when she had a biliary tract problem requiring surgery.  Patient is a 43 y.o. female presenting with constipation. The history is provided by the patient.  Constipation Severity:  Severe Time since last bowel movement:  2 weeks Timing:  Constant Progression:  Unchanged Chronicity:  New Context: dietary changes (increased protein supplementation)   Context: not dehydration, not medication and not narcotics   Stool description:  None produced Relieved by:  Nothing Worsened by:  Nothing tried Ineffective treatments: tried miralax once last night. Associated symptoms: abdominal pain (RUQ), back pain and flatus   Associated symptoms: no diarrhea, no fever and no vomiting     Past Medical History  Diagnosis Date  . Ovarian cyst   . Anxiety   . Asthma    Past Surgical History  Procedure Laterality Date  . Cholecystectomy    . Cesarean section    . Ercp    . Ovarian cyst removal    . Ercp    . Tubal ligation     No family history on file. History  Substance Use Topics  . Smoking status: Never Smoker   . Smokeless tobacco: Not on file  . Alcohol Use: Yes     Comment: occasional   OB History   Grav Para Term Preterm Abortions TAB SAB Ect Mult Living                 Review of Systems  Constitutional: Negative for fever.  Gastrointestinal: Positive for abdominal pain (RUQ), constipation and flatus. Negative for vomiting and diarrhea.  Musculoskeletal: Positive for back pain.  All other systems reviewed and are negative.    Allergies  Ivp dye  Home Medications   Current Outpatient Rx  Name  Route  Sig  Dispense  Refill  .  amoxicillin-clavulanate (AUGMENTIN) 875-125 MG per tablet   Oral   Take 1 tablet by mouth 2 (two) times daily.         . ferrous sulfate 325 (65 FE) MG tablet   Oral   Take 1 tablet (325 mg total) by mouth daily.   30 tablet   0   . Multiple Vitamin (MULITIVITAMIN WITH MINERALS) TABS   Oral   Take 1 tablet by mouth daily.         . naproxen sodium (ANAPROX) 550 MG tablet   Oral   Take 550 mg by mouth 2 (two) times daily with a meal.         . OVER THE COUNTER MEDICATION   Oral   Take 2 tablets by mouth daily. Slim Trim You supplement         . PARoxetine (PAXIL) 30 MG tablet   Oral   Take 30 mg by mouth every morning.          BP 124/77  Pulse 83  Resp 18  SpO2 100% Physical Exam  Nursing note and vitals reviewed. Constitutional: She is oriented to person, place, and time. She appears well-developed and well-nourished. No distress.  HENT:  Head: Normocephalic and atraumatic.  Eyes: EOM are normal. Pupils are equal, round, and reactive to light.  Neck: Normal range of motion. Neck supple.  Cardiovascular: Normal rate and  regular rhythm.  Exam reveals no friction rub.   No murmur heard. Pulmonary/Chest: Effort normal and breath sounds normal. No respiratory distress. She has no wheezes. She has no rales.  Abdominal: Soft. She exhibits no distension. There is tenderness (mild, RUQ). There is no rebound.  Genitourinary: Rectal exam shows no external hemorrhoid, no fissure and no tenderness.  No fecal impaction  Musculoskeletal: Normal range of motion. She exhibits no edema.  Neurological: She is alert and oriented to person, place, and time.  Skin: She is not diaphoretic.    ED Course  Procedures (including critical care time) Labs Review Labs Reviewed  URINALYSIS, ROUTINE W REFLEX MICROSCOPIC - Abnormal; Notable for the following:    Ketones, ur 15 (*)    All other components within normal limits  CBC - Abnormal; Notable for the following:    WBC 3.9  (*)    All other components within normal limits  PREGNANCY, URINE  COMPREHENSIVE METABOLIC PANEL  LIPASE, BLOOD   Imaging Review Dg Abd 1 View  01/09/2014   CLINICAL DATA:  Constipation for 2 weeks.  EXAM: ABDOMEN - 1 VIEW  COMPARISON:  CT, 01/19/2012  FINDINGS: Moderate increased stool burden is noted throughout the colon. There is no bowel dilation to suggest obstruction or generalized adynamic ileus.  There has been a prior cholecystectomy. A single vascular clip lies in the right mid to lower abdomen.  No other soft tissue abnormality. The bony structures are unremarkable.  IMPRESSION: No obstruction or acute finding.  Moderate increased stool burden throughout the colon.   Electronically Signed   By: Amie Portlandavid  Ormond M.D.   On: 01/09/2014 12:42    EKG Interpretation   None       MDM   1. Constipation    43 year old female presents with constipation. Hasn't had a bowel movement in 2 weeks. If still producing flatus. Denies any fevers, vomiting, diarrhea, nausea. She's also had some right upper quadrant pain. Abdominal pain is similar to prior abdominal pain which he had a biliary tract problem requiring surgery. She took MiraLax once last night without relief for constipation. She was constipated when she had a biliary problem in the past. She's been increasing her protein supplementation as she feels a lot of weights. Here she is not impacted. She has mild right upper quadrant pain on exam. She is no guarding or rebound. She has no other abdominal pain. We'll check flat plate abdomen and labs. Constipated on abdominal xray. Labs normal. No need for further imaging. Instructed patient to use miralax to help with her constipation.     Dagmar HaitWilliam Hilton Saephan, MD 01/09/14 [phone]

## 2014-01-09 NOTE — Discharge Instructions (Signed)

## 2014-06-14 ENCOUNTER — Encounter (HOSPITAL_BASED_OUTPATIENT_CLINIC_OR_DEPARTMENT_OTHER): Payer: Self-pay | Admitting: Emergency Medicine

## 2014-06-14 ENCOUNTER — Emergency Department (HOSPITAL_BASED_OUTPATIENT_CLINIC_OR_DEPARTMENT_OTHER)
Admission: EM | Admit: 2014-06-14 | Discharge: 2014-06-14 | Disposition: A | Discharge status: Home / Self Care | Payer: No Typology Code available for payment source | Attending: Emergency Medicine | Admitting: Emergency Medicine

## 2014-06-14 DIAGNOSIS — K5900 Constipation, unspecified: Secondary | ICD-10-CM

## 2014-06-14 DIAGNOSIS — J45909 Unspecified asthma, uncomplicated: Secondary | ICD-10-CM | POA: Insufficient documentation

## 2014-06-14 DIAGNOSIS — Z9089 Acquired absence of other organs: Secondary | ICD-10-CM | POA: Insufficient documentation

## 2014-06-14 DIAGNOSIS — Z79899 Other long term (current) drug therapy: Secondary | ICD-10-CM | POA: Insufficient documentation

## 2014-06-14 DIAGNOSIS — K589 Irritable bowel syndrome without diarrhea: Secondary | ICD-10-CM | POA: Insufficient documentation

## 2014-06-14 DIAGNOSIS — F411 Generalized anxiety disorder: Secondary | ICD-10-CM | POA: Insufficient documentation

## 2014-06-14 DIAGNOSIS — Z8742 Personal history of other diseases of the female genital tract: Secondary | ICD-10-CM | POA: Insufficient documentation

## 2014-06-14 HISTORY — DX: Irritable bowel syndrome, unspecified: K58.9

## 2014-06-14 LAB — COMPREHENSIVE METABOLIC PANEL
ALBUMIN: 3.9 g/dL (ref 3.5–5.2)
ALK PHOS: 49 U/L (ref 39–117)
ALT: 19 U/L (ref 0–35)
AST: 24 U/L (ref 0–37)
BILIRUBIN TOTAL: 0.5 mg/dL (ref 0.3–1.2)
BUN: 17 mg/dL (ref 6–23)
CHLORIDE: 105 meq/L (ref 96–112)
CO2: 27 meq/L (ref 19–32)
CREATININE: 1 mg/dL (ref 0.50–1.10)
Calcium: 9.4 mg/dL (ref 8.4–10.5)
GFR, EST AFRICAN AMERICAN: 80 mL/min — AB (ref >90)
GFR, EST NON AFRICAN AMERICAN: 69 mL/min — AB (ref >90)
GLUCOSE: 94 mg/dL (ref 70–99)
Potassium: 3.8 mEq/L (ref 3.7–5.3)
Sodium: 142 mEq/L (ref 137–147)
Total Protein: 7.3 g/dL (ref 6.0–8.3)

## 2014-06-14 LAB — OCCULT BLOOD X 1 CARD TO LAB, STOOL: FECAL OCCULT BLD: NEGATIVE

## 2014-06-14 LAB — CBC WITH DIFFERENTIAL/PLATELET
BASOS PCT: 0 % (ref 0–1)
Basophils Absolute: 0 10*3/uL (ref 0.0–0.1)
Eosinophils Absolute: 0 10*3/uL (ref 0.0–0.7)
Eosinophils Relative: 0 % (ref 0–5)
HEMATOCRIT: 39.9 % (ref 36.0–46.0)
HEMOGLOBIN: 13.2 g/dL (ref 12.0–15.0)
LYMPHS ABS: 1.8 10*3/uL (ref 0.7–4.0)
Lymphocytes Relative: 27 % (ref 12–46)
MCH: 29.2 pg (ref 26.0–34.0)
MCHC: 33.1 g/dL (ref 30.0–36.0)
MCV: 88.3 fL (ref 78.0–100.0)
MONO ABS: 0.4 10*3/uL (ref 0.1–1.0)
MONOS PCT: 6 % (ref 3–12)
NEUTROS ABS: 4.6 10*3/uL (ref 1.7–7.7)
Neutrophils Relative %: 67 % (ref 43–77)
Platelets: 247 10*3/uL (ref 150–400)
RBC: 4.52 MIL/uL (ref 3.87–5.11)
RDW: 12.9 % (ref 11.5–15.5)
WBC: 6.8 10*3/uL (ref 4.0–10.5)

## 2014-06-14 NOTE — Discharge Instructions (Signed)
Constipation Constipation is when a person:  Poops (has a bowel movement) less than 3 times a week.  Has a hard time pooping.  Has poop that is dry, hard, or bigger than normal. HOME CARE   Eat foods with a lot of fiber in them. This includes fruits, vegetables, beans, and whole grains such as brown rice.  Avoid fatty foods and foods with a lot of sugar. This includes french fries, hamburgers, cookies, candy, and soda.  If you are not getting enough fiber from food, take products with added fiber in them (supplements).  Drink enough fluid to keep your pee (urine) clear or pale yellow.  Exercise on a regular basis, or as told by your doctor.  Go to the restroom when you feel like you need to poop. Do not hold it.  Only take medicine as told by your doctor. Do not take medicines that help you poop (laxatives) without talking to your doctor first. GET HELP RIGHT AWAY IF:   You have bright red blood in your poop (stool).  Your constipation lasts more than 4 days or gets worse.  You have belly (abdominal) or butt (rectal) pain.  You have thin poop (as thin as a pencil).  You lose weight, and it cannot be explained. MAKE SURE YOU:   Understand these instructions.  Will watch your condition.  Will get help right away if you are not doing well or get worse. Document Released: 05/31/2008 Document Revised: 12/18/2013 Document Reviewed: 09/24/2013 [facility] Patient Information 2015 [place] VistaExitCare, [place]LL[place]. This information is not intended to replace advice given to you by your health care provider. Make sure you discuss any questions you have with your health care provider.  Irritable Bowel Syndrome Irritable bowel syndrome (IBS) is caused by a disturbance of normal bowel function and is a common digestive disorder. You may also hear this condition called spastic colon, mucous colitis, and irritable colon. There is no cure for IBS. However, symptoms often gradually improve or disappear with  a good diet, stress management, and medicine. This condition usually appears in late adolescence or early adulthood. Women develop it twice as often as men. CAUSES  After food has been digested and absorbed in the small intestine, waste material is moved into the large intestine, or colon. In the colon, water and salts are absorbed from the undigested products coming from the small intestine. The remaining residue, or fecal material, is held for elimination. Under normal circumstances, gentle, rhythmic contractions of the bowel walls push the fecal material along the colon toward the rectum. In IBS, however, these contractions are irregular and poorly coordinated. The fecal material is either retained too long, resulting in constipation, or expelled too soon, producing diarrhea. SIGNS AND SYMPTOMS  The most common symptom of IBS is abdominal pain. It is often in the lower left side of the abdomen, but it may occur anywhere in the abdomen. The pain comes from spasms of the bowel muscles happening too much and from the buildup of gas and fecal material in the colon. This pain:  Can range from sharp abdominal cramps to a dull, continuous ache.  Often worsens soon after eating.  Is often relieved by having a bowel movement or passing gas. Abdominal pain is usually accompanied by constipation, but it may also produce diarrhea. The diarrhea often occurs right after a meal or upon waking up in the morning. The stools are often soft, watery, and flecked with mucus. Other symptoms of IBS include:  Bloating.  Loss  of appetite.  Heartburn.  Backache.  Dull pain in the arms or shoulders.  Nausea.  Burping.  Vomiting.  Gas. IBS may also cause symptoms that are unrelated to the digestive system, such as:  Fatigue.  Headaches.  Anxiety.  Shortness of breath.  Trouble concentrating.  Dizziness. These symptoms tend to come and go. DIAGNOSIS  The symptoms of IBS may seem like symptoms of  other, more serious digestive disorders. Your health care provider may want to perform tests to exclude these disorders.  TREATMENT Many medicines are available to help correct bowel function or relieve bowel spasms and abdominal pain. Among the medicines available are:  Laxatives for severe constipation and to help restore normal bowel habits.  Specific antidiarrheal medicines to treat severe or lasting diarrhea.  Antispasmodic agents to relieve intestinal cramps. Your health care provider may also decide to treat you with a mild tranquilizer or sedative during unusually stressful periods in your life. Your health care provider may also prescribe antidepressant medicine. The use of this medicine has been shown to reduce pain and other symptoms of IBS. Remember that if any medicine is prescribed for you, you should take it exactly as directed. Make sure your health care provider knows how well it worked for you. HOME CARE INSTRUCTIONS   Take all medicines as directed by your health care provider.  Avoid foods that are high in fat or oils, such as heavy cream, butter, frankfurters, sausage, and other fatty meats.  Avoid foods that make you go to the bathroom, such as fruit, fruit juice, and dairy products.  Cut out carbonated drinks, chewing gum, and "gassy" foods such as beans and cabbage. This may help relieve bloating and burping.  Eat foods with bran, and drink plenty of liquids with the bran foods. This helps relieve constipation.  Keep track of what foods seem to bring on your symptoms.  Avoid emotionally charged situations or circumstances that produce anxiety.  Start or continue exercising.  Get plenty of rest and sleep. Document Released: 12/13/2005 Document Revised: 12/18/2013 Document Reviewed: 08/02/2008 [facility]Exit[facility] Patient Information 2015 [place]ExitCare, [place]. This information is not intended to replace advice given to you by your health care provider. Make sure you discuss any  questions you have with your health care provider.

## 2014-06-14 NOTE — ED Provider Notes (Signed)
CSN: [id number]     Arrival date & time 06/14/14  1652 History   First MD Initiated Contact with Patient 06/14/14 1701     Chief Complaint  Patient presents with  . Stool Color Change      HPI Patient presents with known history of irritable bowel syndrome.  Has had some constipation for the last few days and to 3 capsules of MiraLAX.  She finally had a bowel movement today and it was a ladder color than normal.  She showed me a picture of her stool and it was a light brown but was not a clay colored stool.  She's had no nausea, vomiting, fever, distention.  She simply resting comfortably has had normal appetite.  Patient has had a past history of a "blocked bile duct" that required an ERCP.  She had a cholecystectomy approximately 10 years ago.  She's had no recent weight loss. Past Medical History  Diagnosis Date  . Ovarian cyst   . Anxiety   . Asthma   . IBS (irritable bowel syndrome)    Past Surgical History  Procedure Laterality Date  . Cholecystectomy    . Cesarean section    . Ercp    . Ovarian cyst removal    . Ercp    . Tubal ligation     No family history on file. History  Substance Use Topics  . Smoking status: Never Smoker   . Smokeless tobacco: Not on file  . Alcohol Use: Yes     Comment: occasional   OB History   Grav Para Term Preterm Abortions TAB SAB Ect Mult Living                 Review of Systems  All other systems reviewed and are negative  Allergies  Ivp dye  Home Medications   Prior to Admission medications   Medication Sig Start Date End Date Taking? Authorizing Provider  amoxicillin-clavulanate (AUGMENTIN) 875-125 MG per tablet Take 1 tablet by mouth 2 (two) times daily.    Historical Provider, MD  ferrous sulfate 325 (65 FE) MG tablet Take 1 tablet (325 mg total) by mouth daily. 10/13/12   Trevor Maceobyn M Albert, PA-C  Multiple Vitamin (MULITIVITAMIN WITH MINERALS) TABS Take 1 tablet by mouth daily.    Historical Provider, MD  naproxen sodium  (ANAPROX) 550 MG tablet Take 550 mg by mouth 2 (two) times daily with a meal.    Historical Provider, MD  OVER THE COUNTER MEDICATION Take 2 tablets by mouth daily. Slim Trim You supplement    Historical Provider, MD  PARoxetine (PAXIL) 30 MG tablet Take 30 mg by mouth every morning.    Historical Provider, MD   BP 107/67  Pulse 59  Temp(Src) 97.7 F (36.5 C) (Oral)  Resp 16  Ht 5\' 5"  (1.651 m)  Wt 148 lb (67.132 kg)  BMI 24.63 kg/m2  SpO2 98% Physical Exam  Nursing note and vitals reviewed. Constitutional: She is oriented to person, place, and time. She appears well-developed and well-nourished. No distress.  HENT:  Head: Normocephalic and atraumatic.  Eyes: Pupils are equal, round, and reactive to light.  Neck: Normal range of motion.  Cardiovascular: Normal rate and intact distal pulses.   Pulmonary/Chest: No respiratory distress.  Abdominal: Soft. Normal appearance and bowel sounds are normal. She exhibits no distension. There is no tenderness. There is no rebound and no guarding.  Musculoskeletal: Normal range of motion.  Neurological: She is alert and oriented to person, place,  and time. No cranial nerve deficit.  Skin: Skin is warm and dry. No rash noted.  Psychiatric: She has a normal mood and affect. Her behavior is normal.    ED Course  Procedures (including critical care time) Labs Review Labs Reviewed  COMPREHENSIVE METABOLIC PANEL - Abnormal; Notable for the following:    GFR calc non Af Amer 69 (*)    GFR calc Af Amer 80 (*)    All other components within normal limits  CBC WITH DIFFERENTIAL  OCCULT BLOOD X 1 CARD TO LAB, STOOL    Imaging Review I discussed with patient the need for increase dietary fiber and she can continue with the MiraLAX.  At this time her LFTs are all normal.    MDM   Final diagnoses:  IBS (irritable bowel syndrome)  Constipation, unspecified constipation type        Nelia Shiobert L Ethelmae Ringel, MD 06/14/14 2304

## 2014-06-14 NOTE — ED Notes (Signed)
Pale colored stool started today.  Fatigue x1 week.  GI could not see her today.

## 2015-03-30 ENCOUNTER — Encounter (HOSPITAL_BASED_OUTPATIENT_CLINIC_OR_DEPARTMENT_OTHER): Payer: Self-pay

## 2015-03-30 ENCOUNTER — Emergency Department (HOSPITAL_BASED_OUTPATIENT_CLINIC_OR_DEPARTMENT_OTHER)
Admission: EM | Admit: 2015-03-30 | Discharge: 2015-03-31 | Disposition: A | Discharge status: Home / Self Care | Payer: No Typology Code available for payment source | Attending: Emergency Medicine | Admitting: Emergency Medicine

## 2015-03-30 DIAGNOSIS — Z791 Long term (current) use of non-steroidal anti-inflammatories (NSAID): Secondary | ICD-10-CM | POA: Insufficient documentation

## 2015-03-30 DIAGNOSIS — Z3202 Encounter for pregnancy test, result negative: Secondary | ICD-10-CM | POA: Insufficient documentation

## 2015-03-30 DIAGNOSIS — Z79899 Other long term (current) drug therapy: Secondary | ICD-10-CM | POA: Insufficient documentation

## 2015-03-30 DIAGNOSIS — J45909 Unspecified asthma, uncomplicated: Secondary | ICD-10-CM | POA: Insufficient documentation

## 2015-03-30 DIAGNOSIS — R51 Headache: Secondary | ICD-10-CM

## 2015-03-30 DIAGNOSIS — F419 Anxiety disorder, unspecified: Secondary | ICD-10-CM | POA: Insufficient documentation

## 2015-03-30 DIAGNOSIS — R519 Headache, unspecified: Secondary | ICD-10-CM

## 2015-03-30 DIAGNOSIS — E860 Dehydration: Secondary | ICD-10-CM | POA: Insufficient documentation

## 2015-03-30 DIAGNOSIS — Z792 Long term (current) use of antibiotics: Secondary | ICD-10-CM | POA: Insufficient documentation

## 2015-03-30 DIAGNOSIS — Z8742 Personal history of other diseases of the female genital tract: Secondary | ICD-10-CM | POA: Insufficient documentation

## 2015-03-30 LAB — CBC WITH DIFFERENTIAL/PLATELET
Basophils Absolute: 0 10*3/uL (ref 0.0–0.1)
Basophils Relative: 0 % (ref 0–1)
EOS PCT: 0 % (ref 0–5)
Eosinophils Absolute: 0 10*3/uL (ref 0.0–0.7)
HEMATOCRIT: 40.9 % (ref 36.0–46.0)
Hemoglobin: 13.5 g/dL (ref 12.0–15.0)
LYMPHS ABS: 0.8 10*3/uL (ref 0.7–4.0)
LYMPHS PCT: 9 % — AB (ref 12–46)
MCH: 28.8 pg (ref 26.0–34.0)
MCHC: 33 g/dL (ref 30.0–36.0)
MCV: 87.4 fL (ref 78.0–100.0)
MONO ABS: 0.2 10*3/uL (ref 0.1–1.0)
MONOS PCT: 3 % (ref 3–12)
NEUTROS ABS: 8.2 10*3/uL — AB (ref 1.7–7.7)
NEUTROS PCT: 88 % — AB (ref 43–77)
Platelets: 252 10*3/uL (ref 150–400)
RBC: 4.68 MIL/uL (ref 3.87–5.11)
RDW: 12.5 % (ref 11.5–15.5)
WBC: 9.3 10*3/uL (ref 4.0–10.5)

## 2015-03-30 LAB — BASIC METABOLIC PANEL
Anion gap: 9 (ref 5–15)
BUN: 14 mg/dL (ref 6–23)
CALCIUM: 8.7 mg/dL (ref 8.4–10.5)
CHLORIDE: 106 mmol/L (ref 96–112)
CO2: 23 mmol/L (ref 19–32)
CREATININE: 0.65 mg/dL (ref 0.50–1.10)
GFR calc Af Amer: >90 mL/min (ref >90)
Glucose, Bld: 108 mg/dL — ABNORMAL HIGH (ref 70–99)
Potassium: 3.6 mmol/L (ref 3.5–5.1)
Sodium: 138 mmol/L (ref 135–145)

## 2015-03-30 LAB — URINALYSIS, ROUTINE W REFLEX MICROSCOPIC
Bilirubin Urine: NEGATIVE
Glucose, UA: NEGATIVE mg/dL
HGB URINE DIPSTICK: NEGATIVE
Leukocytes, UA: NEGATIVE
NITRITE: NEGATIVE
Protein, ur: NEGATIVE mg/dL
SPECIFIC GRAVITY, URINE: 1.028 (ref 1.005–1.030)
Urobilinogen, UA: 0.2 mg/dL (ref 0.0–1.0)
pH: 5.5 (ref 5.0–8.0)

## 2015-03-30 LAB — PREGNANCY, URINE: PREG TEST UR: NEGATIVE

## 2015-03-30 MED ORDER — SODIUM CHLORIDE 0.9 % IV BOLUS (SEPSIS)
1000.0000 mL | Freq: Once | INTRAVENOUS | Status: AC
  Administered 2015-03-30: 1000 mL via INTRAVENOUS

## 2015-03-30 MED ORDER — ONDANSETRON HCL 4 MG/2ML IJ SOLN
4.0000 mg | Freq: Once | INTRAMUSCULAR | Status: AC
  Administered 2015-03-30: 4 mg via INTRAVENOUS
  Filled 2015-03-30: qty 2

## 2015-03-30 MED ORDER — KETOROLAC TROMETHAMINE 30 MG/ML IJ SOLN
30.0000 mg | Freq: Once | INTRAMUSCULAR | Status: AC
Start: 2015-03-30 — End: 2015-03-30
  Administered 2015-03-30: 30 mg via INTRAVENOUS
  Filled 2015-03-30: qty 1

## 2015-03-30 NOTE — ED Notes (Signed)
Pt reports today has been playing soccer, states she feels dehydrated, has ha and has been vomiting.  Denies diarrhea or abd pain.

## 2015-03-31 NOTE — Discharge Instructions (Signed)
Tylenol or Motrin for headache. Rest. Make sure to drink plenty of fluids. Follow-up with your doctor as needed  Dehydration, Adult Dehydration is when you lose more fluids from the body than you take in. Vital organs like the kidneys, brain, and heart cannot function without a proper amount of fluids and salt. Any loss of fluids from the body can cause dehydration.  CAUSES   Vomiting.  Diarrhea.  Excessive sweating.  Excessive urine output.  Fever. SYMPTOMS  Mild dehydration  Thirst.  Dry lips.  Slightly dry mouth. Moderate dehydration  Very dry mouth.  Sunken eyes.  Skin does not bounce back quickly when lightly pinched and released.  Dark urine and decreased urine production.  Decreased tear production.  Headache. Severe dehydration  Very dry mouth.  Extreme thirst.  Rapid, weak pulse (more than 100 beats per minute at rest).  Cold hands and feet.  Not able to sweat in spite of heat and temperature.  Rapid breathing.  Blue lips.  Confusion and lethargy.  Difficulty being awakened.  Minimal urine production.  No tears. DIAGNOSIS  Your caregiver will diagnose dehydration based on your symptoms and your exam. Blood and urine tests will help confirm the diagnosis. The diagnostic evaluation should also identify the cause of dehydration. TREATMENT  Treatment of mild or moderate dehydration can often be done at home by increasing the amount of fluids that you drink. It is best to drink small amounts of fluid more often. Drinking too much at one time can make vomiting worse. Refer to the home care instructions below. Severe dehydration needs to be treated at the hospital where you will probably be given intravenous (IV) fluids that contain water and electrolytes. HOME CARE INSTRUCTIONS   Ask your caregiver about specific rehydration instructions.  Drink enough fluids to keep your urine clear or pale yellow.  Drink small amounts frequently if you have  nausea and vomiting.  Eat as you normally do.  Avoid:  Foods or drinks high in sugar.  Carbonated drinks.  Juice.  Extremely hot or cold fluids.  Drinks with caffeine.  Fatty, greasy foods.  Alcohol.  Tobacco.  Overeating.  Gelatin desserts.  Wash your hands well to avoid spreading bacteria and viruses.  Only take over-the-counter or prescription medicines for pain, discomfort, or fever as directed by your caregiver.  Ask your caregiver if you should continue all prescribed and over-the-counter medicines.  Keep all follow-up appointments with your caregiver. SEEK MEDICAL CARE IF:  You have abdominal pain and it increases or stays in one area (localizes).  You have a rash, stiff neck, or severe headache.  You are irritable, sleepy, or difficult to awaken.  You are weak, dizzy, or extremely thirsty. SEEK IMMEDIATE MEDICAL CARE IF:   You are unable to keep fluids down or you get worse despite treatment.  You have frequent episodes of vomiting or diarrhea.  You have blood or green matter (bile) in your vomit.  You have blood in your stool or your stool looks black and tarry.  You have not urinated in 6 to 8 hours, or you have only urinated a small amount of very dark urine.  You have a fever.  You faint. MAKE SURE YOU:   Understand these instructions.  Will watch your condition.  Will get help right away if you are not doing well or get worse. Document Released: 12/13/2005 Document Revised: 03/06/2012 Document Reviewed: 08/02/2011 [facility]Exit[facility] Patient Information 2015 [place]ExitCare, [place]LL[place]. This information is not intended to replace advice  given to you by your health care provider. Make sure you discuss any questions you have with your health care provider.

## 2015-03-31 NOTE — ED Provider Notes (Signed)
CSN: [id number]     Arrival date & time 03/30/15  2045 History   First MD Initiated Contact with Patient 03/30/15 2313     Chief Complaint  Patient presents with  . Headache     (Consider location/radiation/quality/duration/timing/severity/associated sxs/prior Treatment) HPI Latoya Schmidt is a 44 y.o. female history of anxiety, presents to emergency department complaining of nausea, vomiting, headache, weakness. Patient states she plays soccer for a longtime outside today for the first time ever in her life. She was running on a field when she started feeling weak and started getting a headache. She states she has not had any water all day today and had his walker and all bars her meal all day. She states she went home when she started feeling worse, she reports increased nausea, vomited twice, headache. She did not take anything at home. She states she feels dizzy and lightheaded.    Past Medical History  Diagnosis Date  . Ovarian cyst   . Anxiety   . Asthma   . IBS (irritable bowel syndrome)    Past Surgical History  Procedure Laterality Date  . Cholecystectomy    . Cesarean section    . Ercp    . Ovarian cyst removal    . Ercp    . Tubal ligation     No family history on file. History  Substance Use Topics  . Smoking status: Never Smoker   . Smokeless tobacco: Not on file  . Alcohol Use: Yes     Comment: occasional   OB History    No data available     Review of Systems  Constitutional: Positive for appetite change. Negative for fever and chills.  Respiratory: Negative for cough, chest tightness and shortness of breath.   Cardiovascular: Negative for chest pain, palpitations and leg swelling.  Gastrointestinal: Positive for nausea and vomiting. Negative for abdominal pain and diarrhea.  Genitourinary: Negative for dysuria and flank pain.  Musculoskeletal: Negative for myalgias, arthralgias, neck pain and neck stiffness.  Skin: Negative for rash.  Neurological:  Positive for dizziness, weakness, light-headedness and headaches.  All other systems reviewed and are negative.     Allergies  Ivp dye  Home Medications   Prior to Admission medications   Medication Sig Start Date End Date Taking? Authorizing Provider  methylphenidate (RITALIN) 10 MG tablet Take 18 mg by mouth 2 (two) times daily.   Yes Historical Provider, MD  amoxicillin-clavulanate (AUGMENTIN) 875-125 MG per tablet Take 1 tablet by mouth 2 (two) times daily.    Historical Provider, MD  ferrous sulfate 325 (65 FE) MG tablet Take 1 tablet (325 mg total) by mouth daily. 10/13/12   Kathrynn Speedobyn M Hess, PA-C  Multiple Vitamin (MULITIVITAMIN WITH MINERALS) TABS Take 1 tablet by mouth daily.    Historical Provider, MD  naproxen sodium (ANAPROX) 550 MG tablet Take 550 mg by mouth 2 (two) times daily with a meal.    Historical Provider, MD  OVER THE COUNTER MEDICATION Take 2 tablets by mouth daily. Slim Trim You supplement    Historical Provider, MD  PARoxetine (PAXIL) 30 MG tablet Take 30 mg by mouth every morning.    Historical Provider, MD   BP 110/69 mmHg  Pulse 61  Temp(Src) 97.8 F (36.6 C) (Oral)  Resp 16  Ht 5\' 7"  (1.702 m)  Wt 160 lb (72.576 kg)  BMI 25.05 kg/m2  SpO2 100% Physical Exam  Constitutional: She is oriented to person, place, and time. She appears well-developed and well-nourished.  No distress.  HENT:  Head: Normocephalic.  Eyes: Conjunctivae and EOM are normal. Pupils are equal, round, and reactive to light.  Neck: Normal range of motion. Neck supple.  Cardiovascular: Normal rate, regular rhythm and normal heart sounds.   Pulmonary/Chest: Effort normal and breath sounds normal. No respiratory distress. She has no wheezes. She has no rales.  Abdominal: Soft. Bowel sounds are normal. She exhibits no distension. There is no tenderness. There is no rebound.  Musculoskeletal: She exhibits no edema.  Neurological: She is alert and oriented to person, place, and time. No  cranial nerve deficit. Coordination normal.  Skin: Skin is warm and dry.  Psychiatric: She has a normal mood and affect. Her behavior is normal.  Nursing note and vitals reviewed.   ED Course  Procedures (including critical care time) Labs Review Labs Reviewed  URINALYSIS, ROUTINE W REFLEX MICROSCOPIC - Abnormal; Notable for the following:    APPearance CLOUDY (*)    Ketones, ur >80 (*)    All other components within normal limits  CBC WITH DIFFERENTIAL/PLATELET - Abnormal; Notable for the following:    Neutrophils Relative % 88 (*)    Neutro Abs 8.2 (*)    Lymphocytes Relative 9 (*)    All other components within normal limits  BASIC METABOLIC PANEL - Abnormal; Notable for the following:    Glucose, Bld 108 (*)    All other components within normal limits  PREGNANCY, URINE    Imaging Review No results found.   EKG Interpretation None      MDM   Final diagnoses:  Dehydration  Nonintractable headache, unspecified chronicity pattern, unspecified headache type    patient with headache, dizziness, generalized malaise after playing soccer all day outside and not eating or having any fluids. Her exam is unremarkable. She states that her headache came on gradually. She has history of headaches, never diagnosed with migraines. Will get labs, urinalysis, will start IV fluids for possible dehydration and try Toradol and Zofran for her symptoms.  12:43 AM Urine showed a greater than 80 ketones. Labs unremarkable. Patient received 2 L of IV fluids, Toradol, Zofran, she is feeling much better. She is ready to be discharged home. She is afebrile, nontoxic appearing. Normal vital signs. Stable for discharge home. Most likely dehydration.  Filed Vitals:   03/30/15 2110 03/30/15 2330 03/31/15 0039  BP: 130/78 110/69 107/57  Pulse: 80 61 78  Temp: 97.8 F (36.6 C)    TempSrc: Oral    Resp: Height:  (1.702 m)    Weight: 160 lb (72.576 kg)    SpO2: 100% 100% 100%      Jaynie Crumble, PA-C 03/31/15 0047  Paula Libra, MD 03/31/15 [phone]

## 2015-06-09 ENCOUNTER — Encounter (HOSPITAL_BASED_OUTPATIENT_CLINIC_OR_DEPARTMENT_OTHER): Payer: Self-pay | Admitting: Emergency Medicine

## 2015-06-09 ENCOUNTER — Emergency Department (HOSPITAL_BASED_OUTPATIENT_CLINIC_OR_DEPARTMENT_OTHER): Payer: No Typology Code available for payment source

## 2015-06-09 ENCOUNTER — Emergency Department (HOSPITAL_BASED_OUTPATIENT_CLINIC_OR_DEPARTMENT_OTHER)
Admission: EM | Admit: 2015-06-09 | Discharge: 2015-06-09 | Disposition: A | Discharge status: Home / Self Care | Payer: No Typology Code available for payment source | Attending: Emergency Medicine | Admitting: Emergency Medicine

## 2015-06-09 DIAGNOSIS — J45909 Unspecified asthma, uncomplicated: Secondary | ICD-10-CM | POA: Insufficient documentation

## 2015-06-09 DIAGNOSIS — Y9389 Activity, other specified: Secondary | ICD-10-CM | POA: Insufficient documentation

## 2015-06-09 DIAGNOSIS — S3991XA Unspecified injury of abdomen, initial encounter: Secondary | ICD-10-CM | POA: Diagnosis not present

## 2015-06-09 DIAGNOSIS — S3992XA Unspecified injury of lower back, initial encounter: Secondary | ICD-10-CM | POA: Diagnosis not present

## 2015-06-09 DIAGNOSIS — Z79899 Other long term (current) drug therapy: Secondary | ICD-10-CM | POA: Diagnosis not present

## 2015-06-09 DIAGNOSIS — Z792 Long term (current) use of antibiotics: Secondary | ICD-10-CM | POA: Insufficient documentation

## 2015-06-09 DIAGNOSIS — Z9049 Acquired absence of other specified parts of digestive tract: Secondary | ICD-10-CM | POA: Insufficient documentation

## 2015-06-09 DIAGNOSIS — R1013 Epigastric pain: Secondary | ICD-10-CM

## 2015-06-09 DIAGNOSIS — Z8719 Personal history of other diseases of the digestive system: Secondary | ICD-10-CM | POA: Insufficient documentation

## 2015-06-09 DIAGNOSIS — Z9851 Tubal ligation status: Secondary | ICD-10-CM | POA: Diagnosis not present

## 2015-06-09 DIAGNOSIS — Y998 Other external cause status: Secondary | ICD-10-CM | POA: Insufficient documentation

## 2015-06-09 DIAGNOSIS — Z8742 Personal history of other diseases of the female genital tract: Secondary | ICD-10-CM | POA: Insufficient documentation

## 2015-06-09 DIAGNOSIS — Z791 Long term (current) use of non-steroidal anti-inflammatories (NSAID): Secondary | ICD-10-CM | POA: Insufficient documentation

## 2015-06-09 DIAGNOSIS — F419 Anxiety disorder, unspecified: Secondary | ICD-10-CM | POA: Insufficient documentation

## 2015-06-09 DIAGNOSIS — Y92410 Unspecified street and highway as the place of occurrence of the external cause: Secondary | ICD-10-CM | POA: Insufficient documentation

## 2015-06-09 DIAGNOSIS — Z3202 Encounter for pregnancy test, result negative: Secondary | ICD-10-CM | POA: Insufficient documentation

## 2015-06-09 LAB — COMPREHENSIVE METABOLIC PANEL
ALBUMIN: 4.3 g/dL (ref 3.5–5.0)
ALT: 22 U/L (ref 14–54)
ANION GAP: 7 (ref 5–15)
AST: 26 U/L (ref 15–41)
Alkaline Phosphatase: 35 U/L — ABNORMAL LOW (ref 38–126)
BILIRUBIN TOTAL: 0.6 mg/dL (ref 0.3–1.2)
BUN: 14 mg/dL (ref 6–20)
CALCIUM: 8.8 mg/dL — AB (ref 8.9–10.3)
CHLORIDE: 104 mmol/L (ref 101–111)
CO2: 26 mmol/L (ref 22–32)
CREATININE: 0.98 mg/dL (ref 0.44–1.00)
GFR calc Af Amer: >60 mL/min (ref >60)
GLUCOSE: 148 mg/dL — AB (ref 65–99)
Potassium: 3.5 mmol/L (ref 3.5–5.1)
Sodium: 137 mmol/L (ref 135–145)
Total Protein: 7.8 g/dL (ref 6.5–8.1)

## 2015-06-09 LAB — URINALYSIS, ROUTINE W REFLEX MICROSCOPIC
Bilirubin Urine: NEGATIVE
GLUCOSE, UA: 100 mg/dL — AB
Hgb urine dipstick: NEGATIVE
Ketones, ur: NEGATIVE mg/dL
LEUKOCYTES UA: NEGATIVE
NITRITE: NEGATIVE
PROTEIN: NEGATIVE mg/dL
SPECIFIC GRAVITY, URINE: 1.035 — AB (ref 1.005–1.030)
UROBILINOGEN UA: 0.2 mg/dL (ref 0.0–1.0)
pH: 5.5 (ref 5.0–8.0)

## 2015-06-09 LAB — CBC
HCT: 41.5 % (ref 36.0–46.0)
Hemoglobin: 13.6 g/dL (ref 12.0–15.0)
MCH: 28.8 pg (ref 26.0–34.0)
MCHC: 32.8 g/dL (ref 30.0–36.0)
MCV: 87.7 fL (ref 78.0–100.0)
Platelets: 288 10*3/uL (ref 150–400)
RBC: 4.73 MIL/uL (ref 3.87–5.11)
RDW: 12.6 % (ref 11.5–15.5)
WBC: 8.2 10*3/uL (ref 4.0–10.5)

## 2015-06-09 LAB — PREGNANCY, URINE: PREG TEST UR: NEGATIVE

## 2015-06-09 MED ORDER — IOHEXOL 300 MG/ML  SOLN
100.0000 mL | Freq: Once | INTRAMUSCULAR | Status: AC | PRN
  Administered 2015-06-09: 100 mL via INTRAVENOUS

## 2015-06-09 MED ORDER — DIPHENHYDRAMINE HCL 25 MG PO CAPS
50.0000 mg | ORAL_CAPSULE | Freq: Once | ORAL | Status: AC
  Administered 2015-06-09: 50 mg via ORAL
  Filled 2015-06-09: qty 2

## 2015-06-09 MED ORDER — MORPHINE SULFATE 4 MG/ML IJ SOLN
4.0000 mg | Freq: Once | INTRAMUSCULAR | Status: AC
  Administered 2015-06-09: 4 mg via INTRAVENOUS
  Filled 2015-06-09: qty 1

## 2015-06-09 MED ORDER — HYDROCODONE-ACETAMINOPHEN 5-325 MG PO TABS: 1.0000 | ORAL_TABLET | ORAL | Status: DC | PRN

## 2015-06-09 MED ORDER — METHYLPREDNISOLONE SODIUM SUCC 125 MG IJ SOLR
125.0000 mg | Freq: Once | INTRAMUSCULAR | Status: AC
  Administered 2015-06-09: 125 mg via INTRAVENOUS
  Filled 2015-06-09: qty 2

## 2015-06-09 NOTE — ED Notes (Signed)
Patient transported to CT 
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
## 2015-06-09 NOTE — ED Notes (Signed)
PA at bedside.
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
## 2015-06-09 NOTE — ED Notes (Signed)
PA at bedside discussing test results and dispo plan of care. 

## 2015-07-26 ENCOUNTER — Emergency Department (HOSPITAL_BASED_OUTPATIENT_CLINIC_OR_DEPARTMENT_OTHER): Payer: No Typology Code available for payment source

## 2015-07-26 ENCOUNTER — Emergency Department (HOSPITAL_BASED_OUTPATIENT_CLINIC_OR_DEPARTMENT_OTHER)
Admission: EM | Admit: 2015-07-26 | Discharge: 2015-07-26 | Disposition: A | Discharge status: Home / Self Care | Payer: Self-pay | Attending: Emergency Medicine | Admitting: Emergency Medicine

## 2015-07-26 ENCOUNTER — Encounter (HOSPITAL_BASED_OUTPATIENT_CLINIC_OR_DEPARTMENT_OTHER): Payer: Self-pay | Admitting: Emergency Medicine

## 2015-07-26 DIAGNOSIS — X58XXXA Exposure to other specified factors, initial encounter: Secondary | ICD-10-CM | POA: Insufficient documentation

## 2015-07-26 DIAGNOSIS — F419 Anxiety disorder, unspecified: Secondary | ICD-10-CM | POA: Insufficient documentation

## 2015-07-26 DIAGNOSIS — Z8719 Personal history of other diseases of the digestive system: Secondary | ICD-10-CM | POA: Insufficient documentation

## 2015-07-26 DIAGNOSIS — Y9289 Other specified places as the place of occurrence of the external cause: Secondary | ICD-10-CM | POA: Insufficient documentation

## 2015-07-26 DIAGNOSIS — R51 Headache: Secondary | ICD-10-CM | POA: Insufficient documentation

## 2015-07-26 DIAGNOSIS — Z79899 Other long term (current) drug therapy: Secondary | ICD-10-CM | POA: Insufficient documentation

## 2015-07-26 DIAGNOSIS — Z3202 Encounter for pregnancy test, result negative: Secondary | ICD-10-CM | POA: Insufficient documentation

## 2015-07-26 DIAGNOSIS — J45909 Unspecified asthma, uncomplicated: Secondary | ICD-10-CM | POA: Insufficient documentation

## 2015-07-26 DIAGNOSIS — Z8742 Personal history of other diseases of the female genital tract: Secondary | ICD-10-CM | POA: Insufficient documentation

## 2015-07-26 DIAGNOSIS — Z791 Long term (current) use of non-steroidal anti-inflammatories (NSAID): Secondary | ICD-10-CM | POA: Insufficient documentation

## 2015-07-26 DIAGNOSIS — Y998 Other external cause status: Secondary | ICD-10-CM | POA: Insufficient documentation

## 2015-07-26 DIAGNOSIS — R519 Headache, unspecified: Secondary | ICD-10-CM

## 2015-07-26 DIAGNOSIS — Y9389 Activity, other specified: Secondary | ICD-10-CM | POA: Insufficient documentation

## 2015-07-26 DIAGNOSIS — X30XXXA Exposure to excessive natural heat, initial encounter: Secondary | ICD-10-CM | POA: Insufficient documentation

## 2015-07-26 DIAGNOSIS — Z792 Long term (current) use of antibiotics: Secondary | ICD-10-CM | POA: Insufficient documentation

## 2015-07-26 DIAGNOSIS — T679XXA Effect of heat and light, unspecified, initial encounter: Secondary | ICD-10-CM

## 2015-07-26 LAB — CBC WITH DIFFERENTIAL/PLATELET
Basophils Absolute: 0 10*3/uL (ref 0.0–0.1)
Basophils Relative: 0 % (ref 0–1)
Eosinophils Absolute: 0 10*3/uL (ref 0.0–0.7)
Eosinophils Relative: 0 % (ref 0–5)
HCT: 44.8 % (ref 36.0–46.0)
HEMOGLOBIN: 14.7 g/dL (ref 12.0–15.0)
LYMPHS ABS: 1.4 10*3/uL (ref 0.7–4.0)
Lymphocytes Relative: 14 % (ref 12–46)
MCH: 28.5 pg (ref 26.0–34.0)
MCHC: 32.8 g/dL (ref 30.0–36.0)
MCV: 87 fL (ref 78.0–100.0)
MONOS PCT: 2 % — AB (ref 3–12)
Monocytes Absolute: 0.2 10*3/uL (ref 0.1–1.0)
NEUTROS ABS: 8.2 10*3/uL — AB (ref 1.7–7.7)
NEUTROS PCT: 84 % — AB (ref 43–77)
Platelets: 293 10*3/uL (ref 150–400)
RBC: 5.15 MIL/uL — AB (ref 3.87–5.11)
RDW: 12.6 % (ref 11.5–15.5)
WBC: 9.8 10*3/uL (ref 4.0–10.5)

## 2015-07-26 LAB — URINALYSIS, ROUTINE W REFLEX MICROSCOPIC
BILIRUBIN URINE: NEGATIVE
Glucose, UA: NEGATIVE mg/dL
HGB URINE DIPSTICK: NEGATIVE
Ketones, ur: NEGATIVE mg/dL
Leukocytes, UA: NEGATIVE
NITRITE: NEGATIVE
Protein, ur: 30 mg/dL — AB
Specific Gravity, Urine: 1.025 (ref 1.005–1.030)
Urobilinogen, UA: 1 mg/dL (ref 0.0–1.0)
pH: 8 (ref 5.0–8.0)

## 2015-07-26 LAB — BASIC METABOLIC PANEL
Anion gap: 13 (ref 5–15)
BUN: 16 mg/dL (ref 6–20)
CHLORIDE: 103 mmol/L (ref 101–111)
CO2: 23 mmol/L (ref 22–32)
Calcium: 9.5 mg/dL (ref 8.9–10.3)
Creatinine, Ser: 0.74 mg/dL (ref 0.44–1.00)
GFR calc Af Amer: >60 mL/min (ref >60)
GFR calc non Af Amer: >60 mL/min (ref >60)
Glucose, Bld: 119 mg/dL — ABNORMAL HIGH (ref 65–99)
Potassium: 3.5 mmol/L (ref 3.5–5.1)
SODIUM: 139 mmol/L (ref 135–145)

## 2015-07-26 LAB — URINE MICROSCOPIC-ADD ON

## 2015-07-26 LAB — PREGNANCY, URINE: Preg Test, Ur: NEGATIVE

## 2015-07-26 LAB — CK: CK TOTAL: 91 U/L (ref 38–234)

## 2015-07-26 MED ORDER — SODIUM CHLORIDE 0.9 % IV BOLUS (SEPSIS)
1000.0000 mL | Freq: Once | INTRAVENOUS | Status: AC
  Administered 2015-07-26: 1000 mL via INTRAVENOUS

## 2015-07-26 MED ORDER — KETOROLAC TROMETHAMINE 30 MG/ML IJ SOLN
30.0000 mg | Freq: Once | INTRAMUSCULAR | Status: AC
  Administered 2015-07-26: 30 mg via INTRAVENOUS
  Filled 2015-07-26: qty 1

## 2015-07-26 MED ORDER — ONDANSETRON HCL 4 MG/2ML IJ SOLN
4.0000 mg | Freq: Once | INTRAMUSCULAR | Status: AC
  Administered 2015-07-26: 4 mg via INTRAVENOUS
  Filled 2015-07-26: qty 2

## 2015-07-26 MED ORDER — METOCLOPRAMIDE HCL 5 MG/ML IJ SOLN
10.0000 mg | Freq: Once | INTRAMUSCULAR | Status: AC
  Administered 2015-07-26: 10 mg via INTRAVENOUS
  Filled 2015-07-26: qty 2

## 2015-07-26 MED ORDER — DIPHENHYDRAMINE HCL 50 MG/ML IJ SOLN
25.0000 mg | Freq: Once | INTRAMUSCULAR | Status: AC
  Administered 2015-07-26: 25 mg via INTRAVENOUS
  Filled 2015-07-26: qty 1

## 2015-07-26 NOTE — ED Notes (Signed)
Patient states that she was playing golf today and "I think I am over heated and dehydrated" Patient reports that she has had N/V after having a headache since about 2 today.

## 2015-07-26 NOTE — Discharge Instructions (Signed)
Heat Illness °If the body is unable maintain a proper body temperature in hot and/or humid conditions during physical activity, severe illness may occur. To maintain a relatively constant body temperature, the body radiates heat out to the environment and evaporates sweat. In very hot and/or humid conditions, these two methods of heat loss may not work properly. In order to perform physically in such a climate, the body must have time to acclimate (make physiological changes to compensate), such as increase the rate of sweating. When performing physical activity in hot and/or humid environments, it is important to stay hydrated. Adequate hydration will help the body sweat properly. If the body temperature is allowed to increase too much, a person's judgment and performance will decline. °SYMPTOMS  °· Dizziness. °· Fatigue. °· Changes in judgment. °· Muscle cramps. °· Weakness. °· Nausea and vomiting. °· Rapid heart rate. °· Fainting. °· Death. °· Diarrhea. °· Seizures. °· Liver failure. °· Kidney failure. °· Low blood pressure. °· Loss of consciousness (coma). °· Elevated body temperature. °CAUSES  °· Hot and/or humid conditions. °· Poor conditioning. °· Not being acclimated to the heat. °· Dehydration. °· Obesity. °· Inappropriate clothing (does not allow water to evaporate). °· Age (very old and very young people). °· Medications: diuretics, caffeine, decongestants, stimulants, some blood pressure medications. °RISK INCREASES WITH: °· Older age (decreased body water, decreased blood supply to skin, resulting in decreased sweating, decreased sweat rate). °· Young boys (decreased sweat rate compared with men). °· Dehydration. °· Not being acclimated to the heat (this takes 1 to 2 hours per day for a minimum of 6 days). °· Waiting until thirsty to drink. °· Use of stimulants. Amphetamines, cocaine, or decongestants increase risk for heat illness. °· Use of diuretics (increases urination). °· Use of medicines with  anticholinergic properties. °· Use of medicines that slow the heart rate. °PREVENTION  °· Maintain needed hydration before, during, and after exercise. °· Wear clothing that allows sweat to evaporate (light colored, lightweight, breathable). °· Take the time to acclimate to the heat. °· Avoid salt tablets (they irritate the stomach). °· Monitor weight after practices. °· Avoid physical activity during the hottest times of day. °PROGNOSIS  °Most athletes who suffer from heat illness will recover completely, if treated. Severe heat illness is a medical emergency and may require hospitalization. °RELATED COMPLICATIONS  °· Rhabdomyolysis (death of muscle, resulting in weakness and pain). °· Acute respiratory distress syndrome (lining of the lung is altered to prevent oxygen from getting into the bloodstream, which can result in death). °· Disseminated intravascular coagulation (spontaneous clotting of the blood, resulting in an inability to make normal blood clots when needed). °· Kidney failure. °· Liver failure. °· Seizures (abnormal electrical activity in the brain). °· Death. °TREATMENT °The most important treatment is to remove affected persons from the heat. Give the patient cool water to drink. For severe cases of heat illness, it may be necessary to cool the patient more aggressively, such as with an ice bath or cold shower. If symptoms persist after treatment, seek medical attention. °MEDICATION  °· Oxygen is used in severe cases, if there is lung damage. °· Fluid injections may be given for hydration. °ACTIVITY  °· A patient may return to sport as soon as he or she is able. °· Heat illness may make an athlete vulnerable to future episodes of heat illness. °· Allow the body to acclimate before performing in hot and/or humid conditions. °DIET  °Drink 8 oz. of fluid before exercise   and 4 oz. of fluid every 15 to 20 minutes during exercise. As an alternative, try to drink about 1 quart of fluid for every hour of  exercise. SEEK MEDICAL CARE IF:   You develop vomiting or diarrhea after exercising in the heat.  Someone collapses while exercising in the heat.  You have increasing problems exercising in the heat.  You notice increased muscle aches after exercising in the heat.  There is a change in the color of your urine after exercise. Document Released: 12/13/2005 Document Revised: 03/06/2012 Document Reviewed: 03/27/2009 [employer] Patient Information 2015 [employer]. This information is not intended to replace advice given to you by your health care provider. Make sure you discuss any questions you have with your health care provider.  

## 2015-07-26 NOTE — ED Notes (Signed)
No problems not when ambulating patient.  Pt denied any dizziness.
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
## 2015-07-26 NOTE — ED Notes (Signed)
Reports, "did not drink much today, had coffee and tea yesterday". Alert, NAD, calm, interactive, c/o HA and nv after golf for 4 hrs in the heat and sun today.

## 2015-12-18 DIAGNOSIS — J45909 Unspecified asthma, uncomplicated: Secondary | ICD-10-CM | POA: Insufficient documentation

## 2015-12-18 DIAGNOSIS — E782 Mixed hyperlipidemia: Secondary | ICD-10-CM | POA: Insufficient documentation

## 2015-12-18 DIAGNOSIS — F411 Generalized anxiety disorder: Secondary | ICD-10-CM | POA: Insufficient documentation

## 2015-12-18 DIAGNOSIS — J4520 Mild intermittent asthma, uncomplicated: Secondary | ICD-10-CM | POA: Insufficient documentation

## 2015-12-18 DIAGNOSIS — K589 Irritable bowel syndrome without diarrhea: Secondary | ICD-10-CM | POA: Insufficient documentation

## 2015-12-18 DIAGNOSIS — F988 Other specified behavioral and emotional disorders with onset usually occurring in childhood and adolescence: Secondary | ICD-10-CM | POA: Insufficient documentation

## 2016-01-13 ENCOUNTER — Emergency Department (HOSPITAL_BASED_OUTPATIENT_CLINIC_OR_DEPARTMENT_OTHER): Payer: No Typology Code available for payment source

## 2016-01-13 ENCOUNTER — Emergency Department (HOSPITAL_BASED_OUTPATIENT_CLINIC_OR_DEPARTMENT_OTHER)
Admission: EM | Admit: 2016-01-13 | Discharge: 2016-01-13 | Disposition: A | Discharge status: Home / Self Care | Payer: No Typology Code available for payment source | Attending: Emergency Medicine | Admitting: Emergency Medicine

## 2016-01-13 ENCOUNTER — Encounter (HOSPITAL_BASED_OUTPATIENT_CLINIC_OR_DEPARTMENT_OTHER): Payer: Self-pay | Admitting: *Deleted

## 2016-01-13 DIAGNOSIS — Z8701 Personal history of pneumonia (recurrent): Secondary | ICD-10-CM | POA: Insufficient documentation

## 2016-01-13 DIAGNOSIS — Z79899 Other long term (current) drug therapy: Secondary | ICD-10-CM | POA: Insufficient documentation

## 2016-01-13 DIAGNOSIS — Z8742 Personal history of other diseases of the female genital tract: Secondary | ICD-10-CM | POA: Insufficient documentation

## 2016-01-13 DIAGNOSIS — R0789 Other chest pain: Secondary | ICD-10-CM

## 2016-01-13 DIAGNOSIS — Z792 Long term (current) use of antibiotics: Secondary | ICD-10-CM | POA: Insufficient documentation

## 2016-01-13 DIAGNOSIS — F419 Anxiety disorder, unspecified: Secondary | ICD-10-CM | POA: Insufficient documentation

## 2016-01-13 DIAGNOSIS — Z8719 Personal history of other diseases of the digestive system: Secondary | ICD-10-CM | POA: Insufficient documentation

## 2016-01-13 DIAGNOSIS — J45909 Unspecified asthma, uncomplicated: Secondary | ICD-10-CM | POA: Insufficient documentation

## 2016-01-13 DIAGNOSIS — Z791 Long term (current) use of non-steroidal anti-inflammatories (NSAID): Secondary | ICD-10-CM | POA: Insufficient documentation

## 2016-01-13 LAB — CBC
HCT: 42.2 % (ref 36.0–46.0)
Hemoglobin: 13.7 g/dL (ref 12.0–15.0)
MCH: 28 pg (ref 26.0–34.0)
MCHC: 32.5 g/dL (ref 30.0–36.0)
MCV: 86.1 fL (ref 78.0–100.0)
Platelets: 289 10*3/uL (ref 150–400)
RBC: 4.9 MIL/uL (ref 3.87–5.11)
RDW: 12.8 % (ref 11.5–15.5)
WBC: 6.1 10*3/uL (ref 4.0–10.5)

## 2016-01-13 LAB — HEPATIC FUNCTION PANEL
ALT: 23 U/L (ref 14–54)
AST: 25 U/L (ref 15–41)
Albumin: 4 g/dL (ref 3.5–5.0)
Alkaline Phosphatase: 39 U/L (ref 38–126)
Bilirubin, Direct: <0.1 mg/dL — ABNORMAL LOW (ref 0.1–0.5)
TOTAL PROTEIN: 7.6 g/dL (ref 6.5–8.1)
Total Bilirubin: 0.4 mg/dL (ref 0.3–1.2)

## 2016-01-13 LAB — D-DIMER, QUANTITATIVE: D-Dimer, Quant: 0.36 ug/mL-FEU (ref 0.00–0.50)

## 2016-01-13 LAB — BASIC METABOLIC PANEL
Anion gap: 6 (ref 5–15)
BUN: 19 mg/dL (ref 6–20)
CHLORIDE: 107 mmol/L (ref 101–111)
CO2: 25 mmol/L (ref 22–32)
Calcium: 9.1 mg/dL (ref 8.9–10.3)
Creatinine, Ser: 0.86 mg/dL (ref 0.44–1.00)
GFR calc Af Amer: >60 mL/min (ref >60)
GFR calc non Af Amer: >60 mL/min (ref >60)
GLUCOSE: 90 mg/dL (ref 65–99)
Potassium: 3.7 mmol/L (ref 3.5–5.1)
Sodium: 138 mmol/L (ref 135–145)

## 2016-01-13 LAB — LIPASE, BLOOD: LIPASE: 27 U/L (ref 11–51)

## 2016-01-13 LAB — TROPONIN I: Troponin I: <0.03 ng/mL

## 2016-01-13 NOTE — ED Notes (Signed)
C/o cp x 2 days tired,  Sob and nausea at times

## 2016-01-13 NOTE — ED Notes (Signed)
Pt verbalizes understanding of d/c instructions and denies any further needs at this time. 

## 2016-01-13 NOTE — Discharge Instructions (Signed)
Nonspecific Chest Pain  There is no evidence of a heart attack or a blood clot in the lung. Follow-up with your doctor for a stress test. Return to the ED if you develop new or worsening symptoms. Chest pain can be caused by many different conditions. There is always a chance that your pain could be related to something serious, such as a heart attack or a blood clot in your lungs. Chest pain can also be caused by conditions that are not life-threatening. If you have chest pain, it is very important to follow up with your health care provider. CAUSES  Chest pain can be caused by:  Heartburn.  Pneumonia or bronchitis.  Anxiety or stress.  Inflammation around your heart (pericarditis) or lung (pleuritis or pleurisy).  A blood clot in your lung.  A collapsed lung (pneumothorax). It can develop suddenly on its own (spontaneous pneumothorax) or from trauma to the chest.  Shingles infection (varicella-zoster virus).  Heart attack.  Damage to the bones, muscles, and cartilage that make up your chest wall. This can include:  Bruised bones due to injury.  Strained muscles or cartilage due to frequent or repeated coughing or overwork.  Fracture to one or more ribs.  Sore cartilage due to inflammation (costochondritis). RISK FACTORS  Risk factors for chest pain may include:  Activities that increase your risk for trauma or injury to your chest.  Respiratory infections or conditions that cause frequent coughing.  Medical conditions or overeating that can cause heartburn.  Heart disease or family history of heart disease.  Conditions or health behaviors that increase your risk of developing a blood clot.  Having had chicken pox (varicella zoster). SIGNS AND SYMPTOMS Chest pain can feel like:  Burning or tingling on the surface of your chest or deep in your chest.  Crushing, pressure, aching, or squeezing pain.  Dull or sharp pain that is worse when you move, cough, or take a  deep breath.  Pain that is also felt in your back, neck, shoulder, or arm, or pain that spreads to any of these areas. Your chest pain may come and go, or it may stay constant. DIAGNOSIS Lab tests or other studies may be needed to find the cause of your pain. Your health care provider may have you take a test called an ambulatory ECG (electrocardiogram). An ECG records your heartbeat patterns at the time the test is performed. You may also have other tests, such as:  Transthoracic echocardiogram (TTE). During echocardiography, sound waves are used to create a picture of all of the heart structures and to look at how blood flows through your heart.  Transesophageal echocardiogram (TEE).This is a more advanced imaging test that obtains images from inside your body. It allows your health care provider to see your heart in finer detail.  Cardiac monitoring. This allows your health care provider to monitor your heart rate and rhythm in real time.  Holter monitor. This is a portable device that records your heartbeat and can help to diagnose abnormal heartbeats. It allows your health care provider to track your heart activity for several days, if needed.  Stress tests. These can be done through exercise or by taking medicine that makes your heart beat more quickly.  Blood tests.  Imaging tests. TREATMENT  Your treatment depends on what is causing your chest pain. Treatment may include:  Medicines. These may include:  Acid blockers for heartburn.  Anti-inflammatory medicine.  Pain medicine for inflammatory conditions.  Antibiotic medicine, if an  infection is present.  Medicines to dissolve blood clots.  Medicines to treat coronary artery disease.  Supportive care for conditions that do not require medicines. This may include:  Resting.  Applying heat or cold packs to injured areas.  Limiting activities until pain decreases. HOME CARE INSTRUCTIONS  If you were prescribed an  antibiotic medicine, finish it all even if you start to feel better.  Avoid any activities that bring on chest pain.  Do not use any tobacco products, including cigarettes, chewing tobacco, or electronic cigarettes. If you need help quitting, ask your health care provider.  Do not drink alcohol.  Take medicines only as directed by your health care provider.  Keep all follow-up visits as directed by your health care provider. This is important. This includes any further testing if your chest pain does not go away.  If heartburn is the cause for your chest pain, you may be told to keep your head raised (elevated) while sleeping. This reduces the chance that acid will go from your stomach into your esophagus.  Make lifestyle changes as directed by your health care provider. These may include:  Getting regular exercise. Ask your health care provider to suggest some activities that are safe for you.  Eating a heart-healthy diet. A registered dietitian can help you to learn healthy eating options.  Maintaining a healthy weight.  Managing diabetes, if necessary.  Reducing stress. SEEK MEDICAL CARE IF:  Your chest pain does not go away after treatment.  You have a rash with blisters on your chest.  You have a fever. SEEK IMMEDIATE MEDICAL CARE IF:   Your chest pain is worse.  You have an increasing cough, or you cough up blood.  You have severe abdominal pain.  You have severe weakness.  You faint.  You have chills.  You have sudden, unexplained chest discomfort.  You have sudden, unexplained discomfort in your arms, back, neck, or jaw.  You have shortness of breath at any time.  You suddenly start to sweat, or your skin gets clammy.  You feel nauseous or you vomit.  You suddenly feel light-headed or dizzy.  Your heart begins to beat quickly, or it feels like it is skipping beats. These symptoms may represent a serious problem that is an emergency. Do not wait to  see if the symptoms will go away. Get medical help right away. Call your local emergency services (911 in the [place] Do not drive yourself to the hospital.   This information is not intended to replace advice given to you by your health care provider. Make sure you discuss any questions you have with your health care provider.   Document Released: 09/22/2005 Document Revised: 01/03/2015 Document Reviewed: 07/19/2014 Elsevier Interactive Patient Education [employer]

## 2016-01-13 NOTE — ED Notes (Signed)
Chest heaviness x 2 days. She was since x 6 weeks with a cough before the chest pain.

## 2016-01-13 NOTE — ED Provider Notes (Signed)
CSN: [id number]     Arrival date & time 01/13/16  1959 History  By signing my name below, I, Octavia Heir, attest that this documentation has been prepared under the direction and in the presence of Glynn Octave, MD. Electronically Signed: Octavia Heir, ED Scribe. 01/13/2016. 9:38 PM.    Chief Complaint  Patient presents with  . Chest Pain      The history is provided by the patient. No language interpreter was used.    HPI Comments: Latoya Schmidt is a 45 y.o. female who has a hx of asthma presents to the Emergency Department complaining of constant, gradual worsening, sharp mid chest tightness that radiates to her arm and associated nausea onset 2 days ago. 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 She does not have any aggravating or modifying factor to her chest pain. She reports having pneumonia about 6 weeks ago and she did not finish her last two days of her 10 day prescription. She still has a lingering cough from her pneumonia. She has a surgical hx of cholecystectomy and oophorectomy. Pt is on birth control. She denies fever, rhinorrhea, sore throat, hx of blood clots, and back pain.  Past Medical History  Diagnosis Date  . Ovarian cyst   . Anxiety   . Asthma   . IBS (irritable bowel syndrome)    Past Surgical History  Procedure Laterality Date  . Cholecystectomy    . Cesarean section    . Ercp    . Ovarian cyst removal    . Ercp    . Tubal ligation     No family history on file. Social History  Substance Use Topics  . Smoking status: Never Smoker   . Smokeless tobacco: None  . Alcohol Use: Yes     Comment: occasional   OB History    No data available     Review of Systems  A complete 10 system review of systems was obtained and all systems are negative except as noted in the HPI and PMH.    Allergies  Ivp dye  Home Medications   Prior to Admission medications   Medication Sig Start Date End Date Taking?  Authorizing Provider  amoxicillin-clavulanate (AUGMENTIN) 875-125 MG per tablet Take 1 tablet by mouth 2 (two) times daily.    Historical Provider, MD  ferrous sulfate 325 (65 FE) MG tablet Take 1 tablet (325 mg total) by mouth daily. 10/13/12   Kathrynn Speed, PA-C  HYDROcodone-acetaminophen (NORCO/VICODIN) 5-325 MG per tablet Take 1-2 tablets by mouth every 4 (four) hours as needed. 06/09/15   Kathrynn Speed, PA-C  methylphenidate (RITALIN) 10 MG tablet Take 18 mg by mouth 2 (two) times daily.    Historical Provider, MD  Multiple Vitamin (MULITIVITAMIN WITH MINERALS) TABS Take 1 tablet by mouth daily.    Historical Provider, MD  naproxen sodium (ANAPROX) 550 MG tablet Take 550 mg by mouth 2 (two) times daily with a meal.    Historical Provider, MD  OVER THE COUNTER MEDICATION Take 2 tablets by mouth daily. Slim Trim You supplement    Historical Provider, MD  PARoxetine (PAXIL) 30 MG tablet Take 30 mg by mouth every morning.    Historical Provider, MD   Triage vitals: BP 134/91 mmHg  Pulse 87  Temp(Src) 98.1 F (36.7 C) (Oral)  Resp 20  Ht  (1.676 m)  Wt 165 lb (74.844 kg)  BMI 26.64 kg/m2  SpO2 100%  Physical Exam  Constitutional: She is oriented to person, place, and time. She appears well-developed and well-nourished. No distress.  HENT:  Head: Normocephalic and atraumatic.  Mouth/Throat: Oropharynx is clear and moist. No oropharyngeal exudate.  Eyes: Conjunctivae and EOM are normal. Pupils are equal, round, and reactive to light.  Neck: Normal range of motion. Neck supple.  No meningismus.  Cardiovascular: Normal rate, regular rhythm, normal heart sounds and intact distal pulses.   No murmur heard. Pulmonary/Chest: Effort normal and breath sounds normal. No respiratory distress. She has no wheezes.  Abdominal: Soft. There is no tenderness. There is no rebound and no guarding.  Musculoskeletal: Normal range of motion. She exhibits no edema or tenderness.  Neurological: She is alert  and oriented to person, place, and time. No cranial nerve deficit. She exhibits normal muscle tone. Coordination normal.  No ataxia on finger to nose bilaterally. No pronator drift. 5/5 strength throughout. CN 2-12 intact.Equal grip strength. Sensation intact.   Skin: Skin is warm.  Psychiatric: She has a normal mood and affect. Her behavior is normal.  Nursing note and vitals reviewed.   ED Course  Procedures  DIAGNOSTIC STUDIES: Oxygen Saturation is 100% on RA, normal by my interpretation.  COORDINATION OF CARE:  9:36 PM Discussed treatment plan with pt at bedside and pt agreed to plan.  Labs Review Labs Reviewed  HEPATIC FUNCTION PANEL - Abnormal; Notable for the following:    Bilirubin, Direct <0.1 (*)    All other components within normal limits  BASIC METABOLIC PANEL  CBC  TROPONIN I  LIPASE, BLOOD  D-DIMER, QUANTITATIVE (NOT AT [facility])    Imaging Review Dg Chest 2 View  01/13/2016  CLINICAL DATA:  Chest heaviness for 2 days.  Shortness of breath EXAM: CHEST  2 VIEW COMPARISON:  05/27/2012 FINDINGS: The heart size and mediastinal contours are within normal limits. Both lungs are clear. The visualized skeletal structures are unremarkable. IMPRESSION: No active cardiopulmonary disease. Electronically Signed   By: Charlett Nose M.D.   On: 01/13/2016 20:54   I have personally reviewed and evaluated these images and lab results as part of my medical decision-making.   EKG Interpretation   Date/Time:  Tuesday January 13 2016 20:10:51 EST Ventricular Rate:  83 PR Interval:  136 QRS Duration: 96 QT Interval:  366 QTC Calculation: 430 R Axis:   72 Text Interpretation:  Normal sinus rhythm with sinus arrhythmia Low  voltage QRS Incomplete right bundle branch block Borderline ECG No  significant change was found Confirmed by Manus Gunning  MD, Shaylan Tutton [phone]) on  01/13/2016 8:14:55 PM      MDM   Final diagnoses:  Atypical chest pain   Ongoing central chest pain since  yesterday. Had pneumonia 6 weeks ago and did not complete antibiotics. Pain is worse with coughing and laying on the right side. Has been doing a lot of working on detailing cars.  EKG normal sinus rhythm, no ischemia. Chest x-ray negative. D-dimer negative. LFTs and lipase normal. Troponin negative with ongoing pain since yesterday.   Low suspicion for ACS, PE, aortic dissection.  Folllowup with PCP for stress test. Return precautions discussed.  I personally performed the services described in this documentation, which was scribed in my presence. The recorded information has been reviewed and is accurate.   Glynn Octave, MD 01/14/16 [phone]

## 2016-12-31 ENCOUNTER — Encounter (HOSPITAL_BASED_OUTPATIENT_CLINIC_OR_DEPARTMENT_OTHER): Payer: Self-pay | Admitting: Emergency Medicine

## 2016-12-31 ENCOUNTER — Emergency Department (HOSPITAL_BASED_OUTPATIENT_CLINIC_OR_DEPARTMENT_OTHER): Payer: Self-pay

## 2016-12-31 ENCOUNTER — Emergency Department (HOSPITAL_BASED_OUTPATIENT_CLINIC_OR_DEPARTMENT_OTHER)
Admission: EM | Admit: 2016-12-31 | Discharge: 2016-12-31 | Disposition: A | Discharge status: Home / Self Care | Payer: Self-pay | Attending: Emergency Medicine | Admitting: Emergency Medicine

## 2016-12-31 ENCOUNTER — Other Ambulatory Visit: Payer: Self-pay

## 2016-12-31 DIAGNOSIS — Z79899 Other long term (current) drug therapy: Secondary | ICD-10-CM | POA: Insufficient documentation

## 2016-12-31 DIAGNOSIS — J45909 Unspecified asthma, uncomplicated: Secondary | ICD-10-CM | POA: Insufficient documentation

## 2016-12-31 DIAGNOSIS — J40 Bronchitis, not specified as acute or chronic: Secondary | ICD-10-CM | POA: Insufficient documentation

## 2016-12-31 MED ORDER — PSEUDOEPHEDRINE HCL ER 120 MG PO TB12: 120.0000 mg | ORAL_TABLET | Freq: Two times a day (BID) | ORAL | 0 refills | Status: DC

## 2016-12-31 MED ORDER — HYDROCOD POLST-CPM POLST ER 10-8 MG/5ML PO SUER: 5.0000 mL | Freq: Two times a day (BID) | ORAL | 0 refills | Status: DC | PRN

## 2016-12-31 MED ORDER — GUAIFENESIN ER 600 MG PO TB12: 600.0000 mg | ORAL_TABLET | Freq: Two times a day (BID) | ORAL | 0 refills | Status: DC

## 2016-12-31 MED FILL — HYDROCODONE-CHLORPHENIRAM S: 10-8 | 14 days supply | Qty: 140 | Fill #0

## 2016-12-31 MED FILL — MUCINEX ER 600 MG TABLET: 600 | 20 days supply | Qty: 40 | Fill #0

## 2016-12-31 MED FILL — SM NASAL DECONGEST ER 120 M: 120 | 7 days supply | Qty: 14 | Fill #0

## 2016-12-31 NOTE — ED Triage Notes (Signed)
Seen at [facility] x 10 days ago  Given meds  Some better but  Cough worse  And now intermittent chest pain

## 2016-12-31 NOTE — Discharge Instructions (Signed)
Take the medications as prescribed, follow up with your doctor next week if the sx have not resolved, return as needed for worsening symptoms

## 2016-12-31 NOTE — ED Provider Notes (Signed)
[facility] Provider Note   CSN: [id number] Arrival date & time: 12/31/16  1422     History   Chief Complaint Chief Complaint  Patient presents with  . Cough    HPI Latoya Schmidt is a 46 y.o. female.  HPI Pt started getting ill two weeks ago.  She was diagnosed with asthmatic bronchitis.  Pt does not feel she is getting better despite inhalers a steroid shot and a course of abx and steroids.  She continues to have a persistent dry cough.  No fevers but nothing is helping the cough. She developed some pain in her chest and her left arm that lasted briefly the other day.  This has come and gone a few times. She still feels like she has phlegm she needs to bring up.  She went to the urgent care to be rechecked and they sent her to the ED bc of the chest pain  Past Medical History:  Diagnosis Date  . Anxiety   . Asthma   . IBS (irritable bowel syndrome)   . Ovarian cyst     There are no active problems to display for this patient.   Past Surgical History:  Procedure Laterality Date  . CESAREAN SECTION    . CHOLECYSTECTOMY    . ERCP    . ERCP    . OVARIAN CYST REMOVAL    . TUBAL LIGATION      OB History    No data available       Home Medications    Prior to Admission medications   Medication Sig Start Date End Date Taking? Authorizing Provider  albuterol (PROVENTIL HFA;VENTOLIN HFA) 108 (90 Base) MCG/ACT inhaler Inhale 2 puffs into the lungs every 6 (six) hours as needed for wheezing or shortness of breath.   Yes Historical Provider, MD  azithromycin (ZITHROMAX) 250 MG tablet Take 250 mg by mouth Nightly.   Yes Historical Provider, MD  benzonatate (TESSALON) 200 MG capsule Take 200 mg by mouth 3 (three) times daily as needed for cough.   Yes Historical Provider, MD  predniSONE (DELTASONE) 10 MG tablet Take 10 mg by mouth daily with breakfast. 6day dose pack   Yes Historical Provider, MD  chlorpheniramine-HYDROcodone (TUSSIONEX PENNKINETIC ER) 10-8  MG/5ML SUER Take 5 mLs by mouth every 12 (twelve) hours as needed for cough. 12/31/16   Linwood Dibbles, MD  ferrous sulfate 325 (65 FE) MG tablet Take 1 tablet (325 mg total) by mouth daily. 10/13/12   Robyn M Hess, PA-C  guaiFENesin (MUCINEX) 600 MG 12 hr tablet Take 1 tablet (600 mg total) by mouth 2 (two) times daily. 12/31/16   Linwood Dibbles, MD  Multiple Vitamin (MULITIVITAMIN WITH MINERALS) TABS Take 1 tablet by mouth daily.    Historical Provider, MD  pseudoephedrine (SUDAFED 12 HOUR) 120 MG 12 hr tablet Take 1 tablet (120 mg total) by mouth every 12 (twelve) hours. 12/31/16   Linwood Dibbles, MD    Family History No family history on file.  Social History Social History  Substance Use Topics  . Smoking status: Never Smoker  . Smokeless tobacco: Never Used  . Alcohol use Yes     Comment: occasional     Allergies   Ivp dye [iodinated diagnostic agents]   Review of Systems Review of Systems  All other systems reviewed and are negative.    Physical Exam Updated Vital Signs BP 119/76 (BP Location: Right Arm)   Pulse 72   Temp 99.9 F (37.7 C)  Resp 16   Ht 5\' 6"  (1.676 m)   Wt 77.1 kg   SpO2 97%   BMI 27.44 kg/m   Physical Exam  Constitutional: She appears well-developed and well-nourished. No distress.  HENT:  Head: Normocephalic and atraumatic.  Right Ear: External ear normal.  Left Ear: External ear normal.  Eyes: Conjunctivae are normal. Right eye exhibits no discharge. Left eye exhibits no discharge. No scleral icterus.  Neck: Neck supple. No tracheal deviation present.  Cardiovascular: Normal rate, regular rhythm and intact distal pulses.   Pulmonary/Chest: Effort normal and breath sounds normal. No stridor. No respiratory distress. She has no wheezes. She has no rales.  Frequent coughing, able to speak in full sentences  Abdominal: Soft. Bowel sounds are normal. She exhibits no distension. There is no tenderness. There is no rebound and no guarding.  Musculoskeletal: She  exhibits no edema or tenderness.  Neurological: She is alert. She has normal strength. No cranial nerve deficit (no facial droop, extraocular movements intact, no slurred speech) or sensory deficit. She exhibits normal muscle tone. She displays no seizure activity. Coordination normal.  Skin: Skin is warm and dry. No rash noted.  Psychiatric: She has a normal mood and affect.  Nursing note and vitals reviewed.    ED Treatments / Results  Labs (all labs ordered are listed, but only abnormal results are displayed) Labs Reviewed - No data to display  EKG  EKG Interpretation  Date/Time:  Friday December 31 2016 14:36:50 EST Ventricular Rate:  89 PR Interval:  128 QRS Duration: 94 QT Interval:  350 QTC Calculation: 425 R Axis:   43 Text Interpretation:  Normal sinus rhythm with sinus arrhythmia Incomplete right bundle branch block Cannot rule out Anterior infarct , age undetermined Abnormal ECG No significant change since last tracing Confirmed by Jatin Naumann  MD-J, Mianna Iezzi [phone]) on 12/31/2016 3:26:15 PM       Radiology Dg Chest 2 View  Result Date: 12/31/2016 CLINICAL DATA:  [age] year old female with a history of cough and chest pain EXAM: CHEST  2 VIEW COMPARISON:  01/13/2016 FINDINGS: Cardiomediastinal silhouette unchanged. No pneumothorax or pleural effusion.  No confluent airspace disease. No central vascular congestion. No displaced fracture. IMPRESSION: No radiographic evidence of acute cardiopulmonary disease. Signed, Yvone NeuJaime S. Loreta AveWagner, DO [facility] Electronically Signed   By: Gilmer MorJaime  Wagner D.O.   On: 12/31/2016 15:43    Procedures Procedures (including critical care time)  Medications Ordered in ED Medications - No data to display   Initial Impression / Assessment and Plan / ED Course  I have reviewed the triage vital signs and the nursing notes.  Pertinent labs & imaging results that were available during my care of the  patient were reviewed by me and considered in my medical decision making (see chart for details).   CXR negative.  EKG unchanged.  Doubt her chest pain is related to a cardiac etiology. Low risk for ACS, and sx are associated with a severe cough. Suspect it is related to her frequent persistent cough, bronchitis.  Will dc home with symptomatic medications.  Follow  Up with PCP  Final Clinical Impressions(s) / ED Diagnoses   Final diagnoses:  Bronchitis    New Prescriptions New Prescriptions   CHLORPHENIRAMINE-HYDROCODONE (TUSSIONEX PENNKINETIC ER) 10-8 MG/5ML SUER    Take 5 mLs by mouth every 12 (twelve) hours as needed for cough.   GUAIFENESIN (MUCINEX) 600 MG 12 HR TABLET    Take 1 tablet (600 mg  total) by mouth 2 (two) times daily.   PSEUDOEPHEDRINE (SUDAFED 12 HOUR) 120 MG 12 HR TABLET    Take 1 tablet (120 mg total) by mouth every 12 (twelve) hours.     Linwood Dibbles, MD 12/31/16 [phone]

## 2017-06-29 ENCOUNTER — Emergency Department (HOSPITAL_BASED_OUTPATIENT_CLINIC_OR_DEPARTMENT_OTHER)
Admission: EM | Admit: 2017-06-29 | Discharge: 2017-06-30 | Disposition: A | Discharge status: Home / Self Care | Payer: No Typology Code available for payment source | Attending: Emergency Medicine | Admitting: Emergency Medicine

## 2017-06-29 ENCOUNTER — Encounter (HOSPITAL_BASED_OUTPATIENT_CLINIC_OR_DEPARTMENT_OTHER): Payer: Self-pay | Admitting: *Deleted

## 2017-06-29 DIAGNOSIS — L03115 Cellulitis of right lower limb: Secondary | ICD-10-CM | POA: Insufficient documentation

## 2017-06-29 DIAGNOSIS — J45909 Unspecified asthma, uncomplicated: Secondary | ICD-10-CM | POA: Insufficient documentation

## 2017-06-29 DIAGNOSIS — W57XXXA Bitten or stung by nonvenomous insect and other nonvenomous arthropods, initial encounter: Secondary | ICD-10-CM

## 2017-06-29 DIAGNOSIS — Z79899 Other long term (current) drug therapy: Secondary | ICD-10-CM | POA: Insufficient documentation

## 2017-06-29 MED ORDER — CEPHALEXIN 500 MG PO CAPS: 500.0000 mg | ORAL_CAPSULE | Freq: Four times a day (QID) | ORAL | 0 refills | Status: DC

## 2017-06-29 NOTE — ED Triage Notes (Addendum)
Pain and redness in her right leg since yesterday. She thinks she got bit by a spider. Pain is going further up her leg with time. She is currently being treated for BV with Flagyl.

## 2017-06-29 NOTE — Discharge Instructions (Signed)
Keflex as prescribed.  Apply heating pad/warm compresses for the next several days.  Return tomorrow at the given time for an ultrasound to rule out a blood clot.

## 2017-06-29 NOTE — ED Provider Notes (Signed)
[facility]   CSN: [id number] Arrival date & time: 06/29/17  2227  By signing my name below, I, Rosario Adie, attest that this documentation has been prepared under the direction and in the presence of [facility]. Electronically Signed: Rosario Adie, ED Scribe. 06/29/17. 11:27 PM.  History   Chief Complaint Chief Complaint  Patient presents with  . Insect Bite   The history is provided by the patient. No language interpreter was used.    HPI Comments: Latoya Schmidt is a 46 y.o. female who presents to the Emergency Department complaining of persistent, worsening area of pain, redness, and swelling to the right medial lower leg beginning yesterday. Per pt, she noticed a small raised area to the right lower medial leg. She reports that her pain over the area has been worsening throughout the day. She also reports some overlying redness to the area. Her pain is worse with ambulation and she notes that her pain shoots upward and into her leg. Pt denies any known insect, arachnid, or tick bites. No h/o PE/DVT, recent long travel, surgery, fracture, or exogenous estrogen usage. She does note that she sits for long periods of time at work and is somewhat immobilized; but otherwise no other prolonged immobilization. No h/o cancer. She is a non-smoker. No illicit drug usage. Pt has been taking Flagyl, with her last dose being today, for treatment of BV. She denies numbness, tingling, weakness, cough, hemoptysis, fever, chills, chest pain, shortness of breath, or any other associated symptoms.   Past Medical History:  Diagnosis Date  . Anxiety   . Asthma   . IBS (irritable bowel syndrome)   . Ovarian cyst    There are no active problems to display for this patient.  Past Surgical History:  Procedure Laterality Date  . CESAREAN SECTION    . CHOLECYSTECTOMY    . ERCP    . ERCP    . OVARIAN CYST REMOVAL    . TUBAL LIGATION     OB History    No  data available     Home Medications    Prior to Admission medications   Medication Sig Start Date End Date Taking? Authorizing Provider  Multiple Vitamin (MULITIVITAMIN WITH MINERALS) TABS Take 1 tablet by mouth daily.   Yes [provider]  albuterol (PROVENTIL HFA;VENTOLIN HFA) 108 (90 Base) MCG/ACT inhaler Inhale 2 puffs into the lungs every 6 (six) hours as needed for wheezing or shortness of breath.    [provider]  azithromycin (ZITHROMAX) 250 MG tablet Take 250 mg by mouth Nightly.    [provider]  benzonatate (TESSALON) 200 MG capsule Take 200 mg by mouth 3 (three) times daily as needed for cough.    [provider]  cephALEXin (KEFLEX) 500 MG capsule Take 1 capsule (500 mg total) by mouth 4 (four) times daily. 06/29/17   Geoffery Lyons, MD  chlorpheniramine-HYDROcodone (TUSSIONEX PENNKINETIC ER) 10-8 MG/5ML SUER Take 5 mLs by mouth every 12 (twelve) hours as needed for cough. 12/31/16   Linwood Dibbles, MD  ferrous sulfate 325 (65 FE) MG tablet Take 1 tablet (325 mg total) by mouth daily. 10/13/12   Hess, Nada Boozer, PA-C  guaiFENesin (MUCINEX) 600 MG 12 hr tablet Take 1 tablet (600 mg total) by mouth 2 (two) times daily. 12/31/16   Linwood Dibbles, MD  predniSONE (DELTASONE) 10 MG tablet Take 10 mg by mouth daily with breakfast. 6day dose pack    [provider]  pseudoephedrine (SUDAFED 12 HOUR) 120 MG 12 hr tablet Take 1 tablet (120 mg total) by mouth every 12 (twelve) hours. 12/31/16   Linwood Dibbles, MD   Family History No family history on file.  Social History Social History  Substance Use Topics  . Smoking status: Never Smoker  . Smokeless tobacco: Never Used  . Alcohol use Yes     Comment: occasional   Allergies   Ivp dye [iodinated diagnostic agents]  Review of Systems Review of Systems  Constitutional: Negative for chills and fever.  Respiratory: Negative for cough and shortness of breath.   Cardiovascular: Negative for chest pain.    Musculoskeletal: Positive for myalgias.  Skin: Positive for color change.       +area of pain, redness, and swelling to right medial lower leg  Neurological: Negative for weakness and numbness.  All other systems reviewed and are negative.  Physical Exam Updated Vital Signs BP 125/88   Pulse 82   Temp 98.4 F (36.9 C) (Oral)   Resp 18   Ht 5\' 6"  (1.676 m)   Wt 74.8 kg (165 lb)   SpO2 100%   BMI 26.63 kg/m   Physical Exam  Constitutional: She appears well-developed and well-nourished. No distress.  HENT:  Head: Normocephalic and atraumatic.  Eyes: Conjunctivae are normal.  Neck: Normal range of motion.  Cardiovascular: Normal rate, regular rhythm, normal heart sounds and intact distal pulses.   No murmur heard. 2+ DP/PT pulses bl, negative Homan's bl. No LE edema, no palpable cords  Pulmonary/Chest: Effort normal and breath sounds normal. No respiratory distress. She has no wheezes. She has no rales.  Abdominal: She exhibits no distension.  Musculoskeletal: Normal range of motion.  Neurological: She is alert.  Skin: No pallor.  4.5cm annular area of erythema and TTP overlying a vein in the right medial lower leg. There is a second 3cm area with similar findings inferior to this. Very ttp even to light touch. Mild induration overlying vein, no fluctuance or swelling noted.   Psychiatric: She has a normal mood and affect. Her behavior is normal.  Nursing note and vitals reviewed.  ED Treatments / Results  DIAGNOSTIC STUDIES: Oxygen Saturation is 100% on RA, normal by my interpretation.   COORDINATION OF CARE: 11:27 PM-Discussed next steps with pt. Pt verbalized understanding and is agreeable with the plan.   Labs (all labs ordered are listed, but only abnormal results are displayed) Labs Reviewed - No data to display  EKG  EKG Interpretation None      Radiology No results found.  Procedures Procedures   Medications Ordered in ED Medications - No data to  display  Initial Impression / Assessment and Plan / ED Course  I have reviewed the triage vital signs and the nursing notes.  Pertinent labs & imaging results that were available during my care of the patient were reviewed by me and considered in my medical decision making (see chart for details).     Patient presents with areas of redness to right lower extremity which began earlier today. Afebrile, vital signs are stable. Peripheral pulses intact. No erythema, swelling, or significant pain of the calves. No insect bites noted. Low suspicion of erysipelas, necrotizing fasciitis, septic joint.  Low suspicion of DVT or PE in the absence of risk factors. History and physical concerning for cellulitis versus superficial thrombophlebitis. Will initiate Keflex for treatment of cellulitis. Patient continues to have significant anxiety related to the possibility of a DVT in  her leg. A family member recently passed away due to complications from PE. She will return tomorrow morning for venous Doppler to evaluate for DVT. Discussed indications for return to the ED sooner. Pt verbalized understanding of and agreement with plan and is safe for discharge home at this time.  Patient seen and evaluated by Dr. Judd Lienelo who who agrees with my assessment and plan. Final Clinical Impressions(s) / ED Diagnoses   Final diagnoses:  Cellulitis of right lower extremity   New Prescriptions Discharge Medication List as of 06/29/2017 11:45 PM    START taking these medications   Details  cephALEXin (KEFLEX) 500 MG capsule Take 1 capsule (500 mg total) by mouth 4 (four) times daily., Starting Wed 06/29/2017, Print       I personally performed the services described in this documentation, which was scribed in my presence. The recorded information has been reviewed and is accurate.    Jeanie SewerFawze, Yuritzi Kamp A, PA-C 06/30/17 [id number]    Geoffery Lyonselo, [place], MD 06/30/17 [phone]

## 2017-06-30 ENCOUNTER — Ambulatory Visit (HOSPITAL_BASED_OUTPATIENT_CLINIC_OR_DEPARTMENT_OTHER)
Admit: 2017-06-30 | Discharge: 2017-06-30 | Disposition: A | Discharge status: Home / Self Care | Payer: Self-pay | Source: Ambulatory Visit | Attending: Emergency Medicine | Admitting: Emergency Medicine

## 2017-06-30 DIAGNOSIS — I82811 Embolism and thrombosis of superficial veins of right lower extremities: Secondary | ICD-10-CM | POA: Insufficient documentation

## 2017-06-30 DIAGNOSIS — W57XXXA Bitten or stung by nonvenomous insect and other nonvenomous arthropods, initial encounter: Secondary | ICD-10-CM | POA: Insufficient documentation

## 2017-06-30 DIAGNOSIS — T148XXA Other injury of unspecified body region, initial encounter: Secondary | ICD-10-CM | POA: Insufficient documentation

## 2017-06-30 NOTE — ED Provider Notes (Signed)
[place] Venous Img Lower Unilateral Right  Result Date: 06/30/2017 CLINICAL DATA:  Pain swelling. EXAM: Right LOWER EXTREMITY VENOUS DOPPLER ULTRASOUND TECHNIQUE: Gray-scale sonography with graded compression, as well as color Doppler and duplex ultrasound were performed to evaluate the lower extremity deep venous systems from the level of the common femoral vein and including the common femoral, femoral, profunda femoral, popliteal and calf veins including the posterior tibial, peroneal and gastrocnemius veins when visible. The superficial great saphenous vein was also interrogated. Spectral Doppler was utilized to evaluate flow at rest and with distal augmentation maneuvers in the common femoral, femoral and popliteal veins. COMPARISON:  CT 06/09/2015 . FINDINGS: Contralateral Common Femoral Vein: Respiratory phasicity is normal and symmetric with the symptomatic side. No evidence of thrombus. Normal compressibility. Common Femoral Vein: No evidence of thrombus. Normal compressibility, respiratory phasicity and response to augmentation. Saphenofemoral Junction: No evidence of thrombus. Normal compressibility and flow on color Doppler imaging. Profunda Femoral Vein: No evidence of thrombus. Normal compressibility and flow on color Doppler imaging. Femoral Vein: No evidence of thrombus. Normal compressibility, respiratory phasicity and response to augmentation. Popliteal Vein: No evidence of thrombus. Normal compressibility, respiratory phasicity and response to augmentation. Calf Veins: No evidence of thrombus. Normal compressibility and flow on color Doppler imaging. Superficial Great Saphenous Vein: No evidence of thrombus. Normal compressibility and flow on color Doppler imaging. Other Findings:  Thrombus noted in a superficial vein at the ankle. IMPRESSION: No evidence of DVT within the right lower extremity. Thrombus noted in a superficial vein at the ankle. Electronically Signed   By: Maisie Fushomas  Register   On:  06/30/2017 10:00     Thrombus noted in superficial vein of the ankle which is where she's having discomfort.  No indication for antibiotics.  I have asked her not to fill the antibiotics.  No deep vein thromboses noted.  Recommended warm compresses elevation in mobility as well as compression stockings.  Answer return to the ER for new or worsening symptoms   Azalia Bilisampos, Katheline Brendlinger, MD 06/30/17 [id number]

## 2017-07-29 DIAGNOSIS — F321 Major depressive disorder, single episode, moderate: Secondary | ICD-10-CM | POA: Insufficient documentation

## 2017-07-29 DIAGNOSIS — R87612 Low grade squamous intraepithelial lesion on cytologic smear of cervix (LGSIL): Secondary | ICD-10-CM | POA: Insufficient documentation

## 2017-08-22 DIAGNOSIS — R87810 Cervical high risk human papillomavirus (HPV) DNA test positive: Secondary | ICD-10-CM | POA: Insufficient documentation

## 2018-05-11 ENCOUNTER — Emergency Department (HOSPITAL_BASED_OUTPATIENT_CLINIC_OR_DEPARTMENT_OTHER)
Admission: EM | Admit: 2018-05-11 | Discharge: 2018-05-11 | Disposition: A | Discharge status: Home / Self Care | Payer: Self-pay | Attending: Physician Assistant | Admitting: Physician Assistant

## 2018-05-11 ENCOUNTER — Encounter (HOSPITAL_BASED_OUTPATIENT_CLINIC_OR_DEPARTMENT_OTHER): Payer: Self-pay | Admitting: Adult Health

## 2018-05-11 ENCOUNTER — Emergency Department (HOSPITAL_BASED_OUTPATIENT_CLINIC_OR_DEPARTMENT_OTHER): Payer: Self-pay

## 2018-05-11 ENCOUNTER — Other Ambulatory Visit: Payer: Self-pay

## 2018-05-11 DIAGNOSIS — Z79899 Other long term (current) drug therapy: Secondary | ICD-10-CM | POA: Insufficient documentation

## 2018-05-11 DIAGNOSIS — J45909 Unspecified asthma, uncomplicated: Secondary | ICD-10-CM | POA: Insufficient documentation

## 2018-05-11 DIAGNOSIS — R002 Palpitations: Secondary | ICD-10-CM | POA: Insufficient documentation

## 2018-05-11 LAB — CBC WITH DIFFERENTIAL/PLATELET
BASOS ABS: 0 10*3/uL (ref 0.0–0.1)
BASOS PCT: 0 %
Eosinophils Absolute: 0 10*3/uL (ref 0.0–0.7)
Eosinophils Relative: 1 %
HCT: 40.1 % (ref 36.0–46.0)
Hemoglobin: 13.6 g/dL (ref 12.0–15.0)
Lymphocytes Relative: 31 %
Lymphs Abs: 2.6 10*3/uL (ref 0.7–4.0)
MCH: 29.2 pg (ref 26.0–34.0)
MCHC: 33.9 g/dL (ref 30.0–36.0)
MCV: 86.1 fL (ref 78.0–100.0)
MONO ABS: 0.6 10*3/uL (ref 0.1–1.0)
Monocytes Relative: 7 %
Neutro Abs: 5.2 10*3/uL (ref 1.7–7.7)
Neutrophils Relative %: 61 %
Platelets: 255 10*3/uL (ref 150–400)
RBC: 4.66 MIL/uL (ref 3.87–5.11)
RDW: 12.8 % (ref 11.5–15.5)
WBC: 8.4 10*3/uL (ref 4.0–10.5)

## 2018-05-11 LAB — TROPONIN I

## 2018-05-11 LAB — COMPREHENSIVE METABOLIC PANEL
ALK PHOS: 41 U/L (ref 38–126)
ALT: 23 U/L (ref 14–54)
ANION GAP: 9 (ref 5–15)
AST: 23 U/L (ref 15–41)
Albumin: 4 g/dL (ref 3.5–5.0)
BILIRUBIN TOTAL: 0.3 mg/dL (ref 0.3–1.2)
BUN: 17 mg/dL (ref 6–20)
CALCIUM: 9.1 mg/dL (ref 8.9–10.3)
CO2: 24 mmol/L (ref 22–32)
Chloride: 105 mmol/L (ref 101–111)
Creatinine, Ser: 0.74 mg/dL (ref 0.44–1.00)
GFR calc Af Amer: >60 mL/min (ref >60)
GFR calc non Af Amer: >60 mL/min (ref >60)
GLUCOSE: 88 mg/dL (ref 65–99)
Potassium: 3.9 mmol/L (ref 3.5–5.1)
Sodium: 138 mmol/L (ref 135–145)
TOTAL PROTEIN: 7.2 g/dL (ref 6.5–8.1)

## 2018-05-11 MED ORDER — FENTANYL CITRATE (PF) 100 MCG/2ML IJ SOLN: 25.0000 ug | Freq: Once | INTRAMUSCULAR | Status: DC

## 2018-05-11 NOTE — ED Notes (Signed)
Pt reports her medication dosage was lowered within the last week. Pt states she is going through a break up and a lot of stress that may have triggered her anxiety. Pt woke up last night with her "heart fluttering or a muscle spasm".

## 2018-05-11 NOTE — ED Provider Notes (Addendum)
[facility] EMERGENCY DEPARTMENT Provider Note   CSN: [id number] Arrival date & time: 05/11/18  1931     History   Chief Complaint Chief Complaint  Patient presents with  . Dizziness    HPI Latoya Schmidt is a 47 y.o. female.  HPI   Patient is a 47 year old female presenting with feeling of heart fluttering that work-up in the melanite.  She reports that she is been having some gastric reflux symptoms.  Then woke up with with heart palpitation.  Patient had no recent travel no prolonged immobility.  Patient is going through  break-up with Latoya Schmidt 8-year prolonged partner.  Patient has been decreasing Latoya Schmidt Effexor prior to this break-up to try to wean off.  Patient's otherwise healthy.  Reports increased stress and increased anxiety at home.  No HI SI.  Past Medical History:  Diagnosis Date  . Anxiety   . Asthma   . IBS (irritable bowel syndrome)   . Ovarian cyst     There are no active problems to display for this patient.   Past Surgical History:  Procedure Laterality Date  . CESAREAN SECTION    . CHOLECYSTECTOMY    . ERCP    . ERCP    . OVARIAN CYST REMOVAL    . TUBAL LIGATION       OB History   None      Home Medications    Prior to Admission medications   Medication Sig Start Date End Date Taking? Authorizing Provider  venlafaxine XR (EFFEXOR-XR) 75 MG 24 hr capsule Take by mouth. 01/02/18  Yes [provider]  albuterol (PROVENTIL HFA;VENTOLIN HFA) 108 (90 Base) MCG/ACT inhaler Inhale 2 puffs into the lungs every 6 (six) hours as needed for wheezing or shortness of breath.    [provider]  azithromycin (ZITHROMAX) 250 MG tablet Take 250 mg by mouth Nightly.    [provider]  benzonatate (TESSALON) 200 MG capsule Take 200 mg by mouth 3 (three) times daily as needed for cough.    [provider]  cephALEXin (KEFLEX) 500 MG capsule Take 1 capsule (500 mg total) by mouth 4 (four) times daily. 06/29/17   Geoffery Lyons, MD  chlorpheniramine-HYDROcodone (TUSSIONEX PENNKINETIC ER) 10-8 MG/5ML SUER Take 5 mLs by mouth every 12 (twelve) hours as needed for cough. 12/31/16   Linwood Dibbles, MD  ferrous sulfate 325 (65 FE) MG tablet Take 1 tablet (325 mg total) by mouth daily. 10/13/12   Hess, Nada Boozer, PA-C  guaiFENesin (MUCINEX) 600 MG 12 hr tablet Take 1 tablet (600 mg total) by mouth 2 (two) times daily. 12/31/16   Linwood Dibbles, MD  Multiple Vitamin (MULITIVITAMIN WITH MINERALS) TABS Take 1 tablet by mouth daily.    [provider]  predniSONE (DELTASONE) 10 MG tablet Take 10 mg by mouth daily with breakfast. 6day dose pack    [provider]  pseudoephedrine (SUDAFED 12 HOUR) 120 MG 12 hr tablet Take 1 tablet (120 mg total) by mouth every 12 (twelve) hours. 12/31/16   Linwood Dibbles, MD    Family History History reviewed. No pertinent family history.  Social History Social History   Tobacco Use  . Smoking status: Never Smoker  . Smokeless tobacco: Never Used  Substance Use Topics  . Alcohol use: Yes    Comment: occasional  . Drug use: No     Allergies   Ivp dye [iodinated diagnostic agents]   Review of Systems Review of Systems  Constitutional: Negative for  activity change.  Respiratory: Negative for shortness of breath.   Cardiovascular: Positive for palpitations. Negative for chest pain.  Gastrointestinal: Negative for abdominal pain.  All other systems reviewed and are negative.    Physical Exam Updated Vital Signs BP 120/87   Pulse 66   Resp 13   SpO2 98%   Physical Exam  Constitutional: She is oriented to person, place, and time. She appears well-developed and well-nourished.  HENT:  Head: Normocephalic and atraumatic.  Eyes: Right eye exhibits no discharge.  Cardiovascular: Normal rate and regular rhythm.  No murmur heard. Pulmonary/Chest: Effort normal and breath sounds normal. No stridor. No respiratory distress.  Abdominal: Soft. She exhibits no distension.  There is no tenderness.  Neurological: She is oriented to person, place, and time.  Skin: Skin is warm and dry. She is not diaphoretic.  Psychiatric: She has a normal mood and affect.  Nursing note and vitals reviewed.    ED Treatments / Results  Labs (all labs ordered are listed, but only abnormal results are displayed) Labs Reviewed  COMPREHENSIVE METABOLIC PANEL  CBC WITH DIFFERENTIAL/PLATELET  TROPONIN I    EKG  None  Nl EKG NLrate NSR.     EKG Interpretation  Date/Time:    Ventricular Rate:    PR Interval:    QRS Duration:   QT Interval:    QTC Calculation:   R Axis:     Text Interpretation:          ED ECG REPORT  Radiology No results found.  Procedures Procedures (including critical care time)  Medications Ordered in ED Medications - No data to display   Initial Impression / Assessment and Plan / ED Course  I have reviewed the triage vital signs and the nursing notes.  Pertinent labs & imaging results that were available during my care of the patient were reviewed by me and considered in my medical decision making (see chart for details).     Patient is a 47 year old female presenting with feeling of heart fluttering that work-up in the melanite.  She reports that she is been having some gastric reflux symptoms.  Then woke up with with heart palpitation.  Patient had no recent travel no prolonged immobility.  Patient is going through  break-up with Latoya Schmidt 8-year prolonged partner.  Patient has been decreasing Latoya Schmidt Effexor prior to this break-up to try to wean off.  Patient's otherwise healthy.  Reports increased stress and increased anxiety at home.  No HI SI.  11:00 PM Isolated heart palpitations.  Does not sound concerning.  We will do baseline lab work.  Presumtively  treat for GERD and then have follow-up with primary care physician.  Potentially for Holter monitor as an outpatient.  Final Clinical Impressions(s) / ED Diagnoses   Final  diagnoses:  Palpitations    ED Discharge Orders    None       Abelino Derrick, MD 05/11/18 2307    Abelino Derrick, MD 06/05/18 2300

## 2018-05-11 NOTE — ED Triage Notes (Signed)
PResents with palpitations that woke patient in the middle of the night, associated with dizziness all day todat and sharp left sided chest pain that comes and goes. SHe started taking effexor for anxiety and depression 6 months ago and reports an increase in her stressors.

## 2018-05-11 NOTE — Discharge Instructions (Signed)
Your lab work was very reassuring.  You did not have any evidence of anemia, infection, your kidney and liver function appear reassuring.  And your troponin (which shows if your heart is stressed is negative).  Your EKG is reassuring.  Youmay need to have a Holter monitor as an outpatient  Return with any shortness of breath, sweating, or other concerns.

## 2018-05-15 ENCOUNTER — Ambulatory Visit (HOSPITAL_COMMUNITY)
Admission: RE | Admit: 2018-05-15 | Discharge: 2018-05-15 | Disposition: A | Discharge status: Home / Self Care | Payer: No Typology Code available for payment source | Attending: Psychiatry | Admitting: Psychiatry

## 2018-05-15 DIAGNOSIS — J45909 Unspecified asthma, uncomplicated: Secondary | ICD-10-CM | POA: Insufficient documentation

## 2018-05-15 DIAGNOSIS — F419 Anxiety disorder, unspecified: Secondary | ICD-10-CM | POA: Insufficient documentation

## 2018-05-15 DIAGNOSIS — F332 Major depressive disorder, recurrent severe without psychotic features: Secondary | ICD-10-CM | POA: Insufficient documentation

## 2018-05-15 DIAGNOSIS — K589 Irritable bowel syndrome without diarrhea: Secondary | ICD-10-CM | POA: Insufficient documentation

## 2018-05-15 NOTE — H&P (Signed)
Behavioral Health Medical Screening Exam  Latoya Schmidt is an 47 y.o. female.  Total Time spent with patient: 20 minutes  Psychiatric Specialty Exam: Physical Exam  Nursing note and vitals reviewed. Constitutional: She is oriented to person, place, and time. She appears well-developed and well-nourished.  Cardiovascular: Normal rate.  Respiratory: Effort normal.  Musculoskeletal: Normal range of motion.  Neurological: She is alert and oriented to person, place, and time.  Skin: Skin is warm.    Review of Systems  Constitutional: Negative.   HENT: Negative.   Eyes: Negative.   Respiratory: Negative.   Cardiovascular: Negative.   Gastrointestinal: Negative.   Genitourinary: Negative.   Musculoskeletal: Negative.   Skin: Negative.   Neurological: Negative.   Endo/Heme/Allergies: Negative.   Psychiatric/Behavioral: Positive for depression. Negative for hallucinations, substance abuse and suicidal ideas. The patient is nervous/anxious.     Blood pressure (!) 127/92, pulse 80, temperature 98.2 F (36.8 C), SpO2 99 %.There is no height or weight on file to calculate BMI.  General Appearance: Casual  Eye Contact:  Good  Speech:  Clear and Coherent and Normal Rate  Volume:  Normal  Mood:  Depressed  Affect:  Congruent  Thought Process:  Goal Directed and Descriptions of Associations: Intact  Orientation:  Full (Time, Place, and Person)  Thought Content:  WDL  Suicidal Thoughts:  No  Homicidal Thoughts:  No  Memory:  Immediate;   Good Recent;   Good Remote;   Good  Judgement:  Fair  Insight:  Good  Psychomotor Activity:  Normal  Concentration: Concentration: Good and Attention Span: Good  Recall:  Good  Fund of Knowledge:Good  Language: Good  Akathisia:  No  Handed:  Right  AIMS (if indicated):     Assets:  Communication Skills Desire for Improvement Financial Resources/Insurance Housing Physical Health Social Support Transportation  Sleep:        Musculoskeletal: Strength & Muscle Tone: within normal limits Gait & Station: normal Patient leans: N/A  Blood pressure (!) 127/92, pulse 80, temperature 98.2 F (36.8 C), SpO2 99 %.  Recommendations:  Based on my evaluation the patient does not appear to have an emergency medical condition.  Gerlene Burdock Travonta Gill, FNP 05/15/2018, 3:07 PM
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
## 2018-07-04 ENCOUNTER — Encounter (HOSPITAL_BASED_OUTPATIENT_CLINIC_OR_DEPARTMENT_OTHER): Payer: Self-pay

## 2018-07-04 ENCOUNTER — Emergency Department (HOSPITAL_BASED_OUTPATIENT_CLINIC_OR_DEPARTMENT_OTHER)
Admission: EM | Admit: 2018-07-04 | Discharge: 2018-07-04 | Disposition: A | Discharge status: Home / Self Care | Payer: Self-pay | Attending: Emergency Medicine | Admitting: Emergency Medicine

## 2018-07-04 ENCOUNTER — Other Ambulatory Visit: Payer: Self-pay

## 2018-07-04 ENCOUNTER — Emergency Department (HOSPITAL_BASED_OUTPATIENT_CLINIC_OR_DEPARTMENT_OTHER): Payer: Self-pay

## 2018-07-04 DIAGNOSIS — R059 Cough, unspecified: Secondary | ICD-10-CM

## 2018-07-04 DIAGNOSIS — Z79899 Other long term (current) drug therapy: Secondary | ICD-10-CM | POA: Insufficient documentation

## 2018-07-04 DIAGNOSIS — J45909 Unspecified asthma, uncomplicated: Secondary | ICD-10-CM | POA: Insufficient documentation

## 2018-07-04 DIAGNOSIS — R05 Cough: Secondary | ICD-10-CM | POA: Insufficient documentation

## 2018-07-04 LAB — CBC WITH DIFFERENTIAL/PLATELET
Basophils Absolute: 0 10*3/uL (ref 0.0–0.1)
Basophils Relative: 0 %
EOS ABS: 0 10*3/uL (ref 0.0–0.7)
Eosinophils Relative: 1 %
HCT: 40 % (ref 36.0–46.0)
HEMOGLOBIN: 13.2 g/dL (ref 12.0–15.0)
LYMPHS ABS: 2 10*3/uL (ref 0.7–4.0)
LYMPHS PCT: 24 %
MCH: 28.7 pg (ref 26.0–34.0)
MCHC: 33 g/dL (ref 30.0–36.0)
MCV: 87 fL (ref 78.0–100.0)
MONOS PCT: 6 %
Monocytes Absolute: 0.5 10*3/uL (ref 0.1–1.0)
NEUTROS PCT: 69 %
Neutro Abs: 5.7 10*3/uL (ref 1.7–7.7)
Platelets: 253 10*3/uL (ref 150–400)
RBC: 4.6 MIL/uL (ref 3.87–5.11)
RDW: 12.9 % (ref 11.5–15.5)
WBC: 8.3 10*3/uL (ref 4.0–10.5)

## 2018-07-04 LAB — TROPONIN I: Troponin I: <0.03 ng/mL (ref <0.03)

## 2018-07-04 LAB — BASIC METABOLIC PANEL
Anion gap: 8 (ref 5–15)
BUN: 17 mg/dL (ref 6–20)
CHLORIDE: 103 mmol/L (ref 98–111)
CO2: 25 mmol/L (ref 22–32)
CREATININE: 0.97 mg/dL (ref 0.44–1.00)
Calcium: 8.9 mg/dL (ref 8.9–10.3)
GFR calc Af Amer: >60 mL/min (ref >60)
GFR calc non Af Amer: >60 mL/min (ref >60)
GLUCOSE: 96 mg/dL (ref 70–99)
POTASSIUM: 3.6 mmol/L (ref 3.5–5.1)
SODIUM: 136 mmol/L (ref 135–145)

## 2018-07-04 MED ORDER — IPRATROPIUM-ALBUTEROL 0.5-2.5 (3) MG/3ML IN SOLN
3.0000 mL | Freq: Four times a day (QID) | RESPIRATORY_TRACT | Status: DC
  Administered 2018-07-04: 3 mL via RESPIRATORY_TRACT
  Filled 2018-07-04: qty 3

## 2018-07-04 NOTE — ED Provider Notes (Signed)
[facility] EMERGENCY DEPARTMENT Provider Note   CSN: [id number] Arrival date & time: 07/04/18  1523     History   Chief Complaint Chief Complaint  Patient presents with  . Cough    HPI Latoya Schmidt is a 47 y.o. female with a past medical history of asthma who presents today for evaluation of cough.  She was seen by her primary care provider approximately 1 week ago and diagnosed with bronchitis which was treated with azithromycin and prednisone.  Patient reports that she is still continuing to have a cough and that it does not seem to have gotten any better.  She denies fevers or chills.  She feels like she has phlegm in her throat.  She denies nasal congestion.  She does report that she is having chest tightness but this is developed over the past 2 days.  She feels like this is most likely from her cough, however she is concerned that it may be her heart.  HPI  Past Medical History:  Diagnosis Date  . Anxiety   . Asthma   . IBS (irritable bowel syndrome)   . Ovarian cyst     There are no active problems to display for this patient.   Past Surgical History:  Procedure Laterality Date  . CESAREAN SECTION    . CHOLECYSTECTOMY    . ERCP    . ERCP    . OVARIAN CYST REMOVAL    . TUBAL LIGATION       OB History   None      Home Medications    Prior to Admission medications   Medication Sig Start Date End Date Taking? Authorizing Provider  albuterol (PROVENTIL HFA;VENTOLIN HFA) 108 (90 Base) MCG/ACT inhaler Inhale 2 puffs into the lungs every 6 (six) hours as needed for wheezing or shortness of breath.    [provider]  azithromycin (ZITHROMAX) 250 MG tablet Take 250 mg by mouth Nightly.    [provider]  benzonatate (TESSALON) 200 MG capsule Take 200 mg by mouth 3 (three) times daily as needed for cough.    [provider]  cephALEXin (KEFLEX) 500 MG capsule Take 1 capsule (500 mg total) by mouth 4 (four) times daily.  06/29/17   Geoffery Lyons, MD  chlorpheniramine-HYDROcodone (TUSSIONEX PENNKINETIC ER) 10-8 MG/5ML SUER Take 5 mLs by mouth every 12 (twelve) hours as needed for cough. 12/31/16   Linwood Dibbles, MD  ferrous sulfate 325 (65 FE) MG tablet Take 1 tablet (325 mg total) by mouth daily. 10/13/12   Hess, Nada Boozer, PA-C  guaiFENesin (MUCINEX) 600 MG 12 hr tablet Take 1 tablet (600 mg total) by mouth 2 (two) times daily. 12/31/16   Linwood Dibbles, MD  Multiple Vitamin (MULITIVITAMIN WITH MINERALS) TABS Take 1 tablet by mouth daily.    [provider]  predniSONE (DELTASONE) 10 MG tablet Take 10 mg by mouth daily with breakfast. 6day dose pack    [provider]  pseudoephedrine (SUDAFED 12 HOUR) 120 MG 12 hr tablet Take 1 tablet (120 mg total) by mouth every 12 (twelve) hours. 12/31/16   Linwood Dibbles, MD  venlafaxine XR (EFFEXOR-XR) 75 MG 24 hr capsule Take by mouth. 01/02/18   [provider]    Family History No family history on file.  Social History Social History   Tobacco Use  . Smoking status: Never Smoker  . Smokeless tobacco: Never Used  Substance Use Topics  . Alcohol use: Yes    Comment: occasional  .  Drug use: No     Allergies   Ivp dye [iodinated diagnostic agents]   Review of Systems Review of Systems  Constitutional: Negative for chills and fever.  HENT: Positive for postnasal drip. Negative for congestion, rhinorrhea, sinus pressure, sinus pain, sore throat and trouble swallowing.   Respiratory: Positive for cough and chest tightness. Negative for shortness of breath.   Cardiovascular: Positive for chest pain.  All other systems reviewed and are negative.    Physical Exam Updated Vital Signs BP 119/79   Pulse 71   Temp 98.1 F (36.7 C) (Oral)   Resp 16   Ht 5\' 7"  (1.702 m)   Wt 78.9 kg (174 lb)   SpO2 98%   BMI 27.25 kg/m   Physical Exam  Constitutional: She appears well-developed and well-nourished. No distress.  HENT:  Head: Normocephalic and  atraumatic.  Mouth/Throat: Oropharynx is clear and moist.  Eyes: Conjunctivae are normal. Right eye exhibits no discharge. Left eye exhibits no discharge. No scleral icterus.  Neck: Normal range of motion.  Cardiovascular: Normal rate, regular rhythm, normal heart sounds and intact distal pulses.  No murmur heard. Pulmonary/Chest: Effort normal and breath sounds normal. No stridor. No respiratory distress. She has no wheezes.  Abdominal: Soft. She exhibits no distension. There is no tenderness.  Musculoskeletal: She exhibits no edema or deformity.  Neurological: She is alert. She exhibits normal muscle tone.  Skin: Skin is warm and dry. She is not diaphoretic.  Psychiatric: She has a normal mood and affect. Her behavior is normal.  Nursing note and vitals reviewed.    ED Treatments / Results  Labs (all labs ordered are listed, but only abnormal results are displayed) Labs Reviewed  CBC WITH DIFFERENTIAL/PLATELET  BASIC METABOLIC PANEL  TROPONIN I    EKG None  Radiology Dg Chest 2 View  Result Date: 07/04/2018 CLINICAL DATA:  Productive cough and shortness of breath for 2 weeks. EXAM: CHEST - 2 VIEW COMPARISON:  05/11/2018 FINDINGS: The heart size and mediastinal contours are within normal limits. Both lungs are clear. The visualized skeletal structures are unremarkable. IMPRESSION: No active cardiopulmonary disease. Electronically Signed   By: Myles RosenthalJohn  Stahl M.D.   On: 07/04/2018 16:18    Procedures Procedures (including critical care time)  Medications Ordered in ED Medications  ipratropium-albuterol (DUONEB) 0.5-2.5 (3) MG/3ML nebulizer solution 3 mL (3 mLs Nebulization Given 07/04/18 1700)     Initial Impression / Assessment and Plan / ED Course  I have reviewed the triage vital signs and the nursing notes.  Pertinent labs & imaging results that were available during my care of the patient were reviewed by me and considered in my medical decision making (see chart for  details).    Patient is to be discharged with recommendation to follow up with PCP in regards to today's hospital visit. Chest pain is not likely of cardiac or pulmonary etiology d/t presentation, PERC negative, VSS, no tracheal deviation, no JVD or new murmur, RRR, breath sounds equal bilaterally, EKG without acute abnormalities, negative troponin, and negative CXR. Pt has been advised to return to the ED if CP becomes exertional, associated with diaphoresis or nausea, radiates to left jaw/arm, worsens or becomes concerning in any way. Pt appears reliable for follow up and is agreeable to discharge.   Suspect that cough is secondary to either postnasal drainage from allergies, or GERD.  Patient reports that she does have a history of heartburn.  Recommended that she start taking Prilosec for heartburn, in  addition to Nasacort and Allegra for allergies to see if that would improve her cough.  She has adequate supply of albuterol at home.  Recommended PCP follow-up.  Return precautions discussed and she states her understanding.   Final Clinical Impressions(s) / ED Diagnoses   Final diagnoses:  Cough    ED Discharge Orders    None       Norman Clay 07/04/18 2014    Loren Racer, MD 07/04/18 2342

## 2018-07-04 NOTE — ED Triage Notes (Signed)
Pt states she was dx with bronchitis-completed abx and steroids approx 1 week ago-states she feels no better-NAD-steady gait

## 2018-07-04 NOTE — Discharge Instructions (Signed)
Please consider taking a daily allergy medication to help with your symptoms.  I suggest a less drowsy 24 hour medication such as allegra, zyrtec or Claritin or the generic version.  Please also start taking a nasal steroid spray such as Nasacort or Flonase.  I recommend starting with Allegra and Nasacort.

## 2018-08-23 ENCOUNTER — Emergency Department (HOSPITAL_BASED_OUTPATIENT_CLINIC_OR_DEPARTMENT_OTHER)
Admission: EM | Admit: 2018-08-23 | Discharge: 2018-08-23 | Disposition: A | Discharge status: Home / Self Care | Payer: Self-pay | Attending: Emergency Medicine | Admitting: Emergency Medicine

## 2018-08-23 ENCOUNTER — Encounter (HOSPITAL_BASED_OUTPATIENT_CLINIC_OR_DEPARTMENT_OTHER): Payer: Self-pay | Admitting: Emergency Medicine

## 2018-08-23 ENCOUNTER — Other Ambulatory Visit: Payer: Self-pay

## 2018-08-23 ENCOUNTER — Emergency Department (HOSPITAL_BASED_OUTPATIENT_CLINIC_OR_DEPARTMENT_OTHER): Payer: Self-pay

## 2018-08-23 DIAGNOSIS — Y93E1 Activity, personal bathing and showering: Secondary | ICD-10-CM | POA: Insufficient documentation

## 2018-08-23 DIAGNOSIS — Z79899 Other long term (current) drug therapy: Secondary | ICD-10-CM | POA: Insufficient documentation

## 2018-08-23 DIAGNOSIS — Y92012 Bathroom of single-family (private) house as the place of occurrence of the external cause: Secondary | ICD-10-CM | POA: Insufficient documentation

## 2018-08-23 DIAGNOSIS — S81811A Laceration without foreign body, right lower leg, initial encounter: Secondary | ICD-10-CM | POA: Insufficient documentation

## 2018-08-23 DIAGNOSIS — T148XXA Other injury of unspecified body region, initial encounter: Secondary | ICD-10-CM

## 2018-08-23 DIAGNOSIS — S76211A Strain of adductor muscle, fascia and tendon of right thigh, initial encounter: Secondary | ICD-10-CM

## 2018-08-23 DIAGNOSIS — W01198A Fall on same level from slipping, tripping and stumbling with subsequent striking against other object, initial encounter: Secondary | ICD-10-CM | POA: Insufficient documentation

## 2018-08-23 DIAGNOSIS — Y999 Unspecified external cause status: Secondary | ICD-10-CM | POA: Insufficient documentation

## 2018-08-23 DIAGNOSIS — J45909 Unspecified asthma, uncomplicated: Secondary | ICD-10-CM | POA: Insufficient documentation

## 2018-08-23 MED ORDER — ACETAMINOPHEN 500 MG PO TABS
1000.0000 mg | ORAL_TABLET | Freq: Once | ORAL | Status: AC
  Administered 2018-08-23: 1000 mg via ORAL
  Filled 2018-08-23: qty 2

## 2018-08-23 MED ORDER — IBUPROFEN 800 MG PO TABS
800.0000 mg | ORAL_TABLET | Freq: Once | ORAL | Status: AC
  Administered 2018-08-23: 800 mg via ORAL
  Filled 2018-08-23: qty 1

## 2018-08-23 NOTE — ED Triage Notes (Signed)
Larey SeatFell getting out of shower this morning.  Hit right shin on sliding door track and stubbed right great toe.  Redness and some swelling to right shin with pain radiating superiorly up right leg.  Pain to right great toe with no appreciable swelling.  No deformity.

## 2018-08-23 NOTE — Discharge Instructions (Signed)
Take 4 over the counter ibuprofen tablets 3 times a day or 2 over-the-counter naproxen tablets twice a day for pain. Also take tylenol 1000mg (2 extra strength) four times a day.   Your Xrays were normal.  Follow up with your PCP.

## 2018-08-23 NOTE — ED Provider Notes (Signed)
[facility] EMERGENCY DEPARTMENT Provider Note   CSN: [id number] Arrival date & time: 08/23/18  0830     History   Chief Complaint Chief Complaint  Patient presents with  . Fall    HPI Latoya Schmidt is a 47 y.o. female.  47 yo F with a chief complaint of pain to the right groin.  This occurred after the patient slipped while trying to get out of the shower.  She stepped with her left foot and felt that slid on the floor and she fell scraping her shin on the lip of the tub.  Complaining of pain to the shin as well as to the groin.  She has a superficial wound to the area.  Denies head injury denies neck pain denies back pain denies chest pain denies abdominal pain.  The history is provided by the patient.  Fall  This is a new problem. The current episode started less than 1 hour ago. The problem occurs constantly. The problem has not changed since onset.Pertinent negatives include no chest pain, no headaches and no shortness of breath. The symptoms are aggravated by bending, twisting and walking. Nothing relieves the symptoms. She has tried nothing for the symptoms. The treatment provided no relief.    Past Medical History:  Diagnosis Date  . Anxiety   . Asthma   . IBS (irritable bowel syndrome)   . Ovarian cyst     There are no active problems to display for this patient.   Past Surgical History:  Procedure Laterality Date  . CESAREAN SECTION    . CHOLECYSTECTOMY    . ERCP    . ERCP    . OVARIAN CYST REMOVAL    . TUBAL LIGATION       OB History   None      Home Medications    Prior to Admission medications   Medication Sig Start Date End Date Taking? Authorizing Provider  albuterol (PROVENTIL HFA;VENTOLIN HFA) 108 (90 Base) MCG/ACT inhaler Inhale 2 puffs into the lungs every 6 (six) hours as needed for wheezing or shortness of breath.    [provider]  Multiple Vitamin (MULITIVITAMIN WITH MINERALS) TABS Take 1 tablet by mouth daily.     [provider]  pseudoephedrine (SUDAFED 12 HOUR) 120 MG 12 hr tablet Take 1 tablet (120 mg total) by mouth every 12 (twelve) hours. 12/31/16   Linwood Dibbles, MD  venlafaxine XR (EFFEXOR-XR) 75 MG 24 hr capsule Take by mouth. 01/02/18   [provider]    Family History No family history on file.  Social History Social History   Tobacco Use  . Smoking status: Never Smoker  . Smokeless tobacco: Never Used  Substance Use Topics  . Alcohol use: Yes    Comment: occasional  . Drug use: No     Allergies   Ivp dye [iodinated diagnostic agents]   Review of Systems Review of Systems  Constitutional: Negative for chills and fever.  HENT: Negative for congestion and rhinorrhea.   Eyes: Negative for redness and visual disturbance.  Respiratory: Negative for shortness of breath and wheezing.   Cardiovascular: Negative for chest pain and palpitations.  Gastrointestinal: Negative for nausea and vomiting.  Genitourinary: Negative for dysuria and urgency.  Musculoskeletal: Positive for gait problem. Negative for arthralgias and myalgias.  Skin: Negative for pallor and wound.  Neurological: Negative for dizziness and headaches.     Physical Exam Updated Vital Signs BP 119/87 (BP Location: Right Arm)   Pulse  73   Temp 98.7 F (37.1 C) (Oral)   Resp 16   Ht 5\' 6"  (1.676 m)   Wt 78.9 kg   SpO2 96%   BMI 28.08 kg/m   Physical Exam  Constitutional: She is oriented to person, place, and time. She appears well-developed and well-nourished. No distress.  HENT:  Head: Normocephalic and atraumatic.  Eyes: Pupils are equal, round, and reactive to light. EOM are normal.  Neck: Normal range of motion. Neck supple.  Cardiovascular: Normal rate and regular rhythm. Exam reveals no gallop and no friction rub.  No murmur heard. Pulmonary/Chest: Effort normal. She has no wheezes. She has no rales.  Abdominal: Soft. She exhibits no distension and no mass. There is no tenderness.  There is no guarding.  Musculoskeletal: She exhibits tenderness. She exhibits no edema.  Mild pain about the anterior shin.  No deformity.  Pulse motor and sensation intact distally.  No pain at the malleolus or base of the fifth metatarsal or navicular.  Pain along the attachment of the adductor to the pelvis.  No abdominal tenderness.  Neurological: She is alert and oriented to person, place, and time.  Skin: Skin is warm and dry. She is not diaphoretic.  Psychiatric: She has a normal mood and affect. Her behavior is normal.  Nursing note and vitals reviewed.    ED Treatments / Results  Labs (all labs ordered are listed, but only abnormal results are displayed) Labs Reviewed - No data to display  EKG None  Radiology Dg Tibia/fibula Right  Result Date: 08/23/2018 CLINICAL DATA:  Patient fell stepping out of the shower this morning. Bruising over the mid shafts of the anterior right tibia fibula. EXAM: RIGHT TIBIA AND FIBULA - 2 VIEW COMPARISON:  None. FINDINGS: The bones are subjectively adequately mineralized. There is no acute fracture or dislocation. The observed portions of the right knee and right ankle are normal. There is mild soft tissue swelling in the mid pretibial region. IMPRESSION: Evidence of soft tissue edema over the mid pretibial region. No underlying fracture nor other acute bony abnormality. Electronically Signed   By: David  [place] M.D.   On: 08/23/2018 09:32   Dg Hip Unilat W Or Wo Pelvis 2-3 Views Right  Result Date: 08/23/2018 CLINICAL DATA:  Larey Seat stepping out of the shower this morning. The patient reports right hip pain. EXAM: DG HIP (WITH OR WITHOUT PELVIS) 2-3V RIGHT COMPARISON:  Coronal and sagittal reconstructed images through the pelvis and hips from an abdominal and pelvic CT scan of July 25, 2017 FINDINGS: The bony pelvis is subjectively adequately mineralized. No lytic or blastic pelvic lesion is occurred is observed. AP and lateral views of the right hip  reveal preservation of the joint space. The articular surfaces of the right femoral head and acetabulum remains smoothly rounded. No acute hip fracture is observed. The observed portions of the sacrum are normal. IMPRESSION: There is no acute bony abnormality of the right hip nor pelvis. Electronically Signed   By: David  [place] M.D.   On: 08/23/2018 09:33    Procedures Procedures (including critical care time)  Medications Ordered in ED Medications  acetaminophen (TYLENOL) tablet 1,000 mg (1,000 mg Oral Given 08/23/18 0923)  ibuprofen (ADVIL,MOTRIN) tablet 800 mg (800 mg Oral Given 08/23/18 1610)     Initial Impression / Assessment and Plan / ED Course  I have reviewed the triage vital signs and the nursing notes.  Pertinent labs & imaging results that were available during my care  of the patient were reviewed by me and considered in my medical decision making (see chart for details).     47 yo F with a chief complaint of pain to the right groin.  Likely this is a groin strain based on the mechanism.  Plain film without fracture as viewed by me.  Will treat supportively.  PCP follow-up.  9:43 AM:  I have discussed the diagnosis/risks/treatment options with the patient and believe the pt to be eligible for discharge home to follow-up with PCP. We also discussed returning to the ED immediately if new or worsening sx occur. We discussed the sx which are most concerning (e.g., sudden worsening pain, fever, inability to tolerate by mouth) that necessitate immediate return. Medications administered to the patient during their visit and any new prescriptions provided to the patient are listed below.  Medications given during this visit Medications  acetaminophen (TYLENOL) tablet 1,000 mg (1,000 mg Oral Given 08/23/18 0923)  ibuprofen (ADVIL,MOTRIN) tablet 800 mg (800 mg Oral Given 08/23/18 [id number])     The patient appears reasonably screen and/or stabilized for discharge and I doubt any other  medical condition or other [facility] requiring further screening, evaluation, or treatment in the ED at this time prior to discharge.    Final Clinical Impressions(s) / ED Diagnoses   Final diagnoses:  Groin strain, right, initial encounter  Superficial laceration    ED Discharge Orders    None       Melene PlanFloyd, Karsen Fellows, DO 08/23/18 [id number]

## 2018-08-30 DIAGNOSIS — N644 Mastodynia: Secondary | ICD-10-CM | POA: Insufficient documentation

## 2018-09-11 DIAGNOSIS — N6310 Unspecified lump in the right breast, unspecified quadrant: Secondary | ICD-10-CM | POA: Insufficient documentation

## 2019-01-11 ENCOUNTER — Emergency Department (HOSPITAL_COMMUNITY): Payer: Self-pay

## 2019-01-11 ENCOUNTER — Emergency Department (HOSPITAL_COMMUNITY)
Admission: EM | Admit: 2019-01-11 | Discharge: 2019-01-11 | Disposition: A | Discharge status: Home / Self Care | Payer: Self-pay | Attending: Emergency Medicine | Admitting: Emergency Medicine

## 2019-01-11 ENCOUNTER — Encounter (HOSPITAL_COMMUNITY): Payer: Self-pay | Admitting: Radiology

## 2019-01-11 DIAGNOSIS — R519 Headache, unspecified: Secondary | ICD-10-CM

## 2019-01-11 DIAGNOSIS — J45909 Unspecified asthma, uncomplicated: Secondary | ICD-10-CM | POA: Insufficient documentation

## 2019-01-11 DIAGNOSIS — Z79899 Other long term (current) drug therapy: Secondary | ICD-10-CM | POA: Insufficient documentation

## 2019-01-11 DIAGNOSIS — R51 Headache: Secondary | ICD-10-CM | POA: Insufficient documentation

## 2019-01-11 LAB — COMPREHENSIVE METABOLIC PANEL
ALBUMIN: 4.1 g/dL (ref 3.5–5.0)
ALT: 21 U/L (ref 0–44)
AST: 20 U/L (ref 15–41)
Alkaline Phosphatase: 41 U/L (ref 38–126)
Anion gap: 9 (ref 5–15)
BUN: 12 mg/dL (ref 6–20)
CHLORIDE: 104 mmol/L (ref 98–111)
CO2: 27 mmol/L (ref 22–32)
Calcium: 9.5 mg/dL (ref 8.9–10.3)
Creatinine, Ser: 0.84 mg/dL (ref 0.44–1.00)
GFR calc Af Amer: >60 mL/min (ref >60)
Glucose, Bld: 94 mg/dL (ref 70–99)
POTASSIUM: 3.7 mmol/L (ref 3.5–5.1)
Sodium: 140 mmol/L (ref 135–145)
Total Bilirubin: 0.5 mg/dL (ref 0.3–1.2)
Total Protein: 7.7 g/dL (ref 6.5–8.1)

## 2019-01-11 LAB — CBC WITH DIFFERENTIAL/PLATELET
ABS IMMATURE GRANULOCYTES: 0.03 10*3/uL (ref 0.00–0.07)
BASOS ABS: 0 10*3/uL (ref 0.0–0.1)
Basophils Relative: 0 %
Eosinophils Absolute: 0 10*3/uL (ref 0.0–0.5)
Eosinophils Relative: 0 %
HCT: 44.1 % (ref 36.0–46.0)
HEMOGLOBIN: 14 g/dL (ref 12.0–15.0)
IMMATURE GRANULOCYTES: 1 %
LYMPHS PCT: 27 %
Lymphs Abs: 1.7 10*3/uL (ref 0.7–4.0)
MCH: 27.7 pg (ref 26.0–34.0)
MCHC: 31.7 g/dL (ref 30.0–36.0)
MCV: 87.3 fL (ref 80.0–100.0)
MONO ABS: 0.4 10*3/uL (ref 0.1–1.0)
Monocytes Relative: 6 %
NEUTROS ABS: 4.3 10*3/uL (ref 1.7–7.7)
Neutrophils Relative %: 66 %
Platelets: 294 10*3/uL (ref 150–400)
RBC: 5.05 MIL/uL (ref 3.87–5.11)
RDW: 12.3 % (ref 11.5–15.5)
WBC: 6.4 10*3/uL (ref 4.0–10.5)
nRBC: 0 % (ref 0.0–0.2)

## 2019-01-11 LAB — I-STAT BETA HCG BLOOD, ED (MC, WL, AP ONLY): I-stat hCG, quantitative: <5 m[IU]/mL (ref <5)

## 2019-01-11 MED ORDER — IBUPROFEN 400 MG PO TABS
400.0000 mg | ORAL_TABLET | Freq: Once | ORAL | Status: AC
Start: 2019-01-11 — End: 2019-01-11
  Administered 2019-01-11: 400 mg via ORAL
  Filled 2019-01-11: qty 1

## 2019-01-11 MED ORDER — METHOCARBAMOL 750 MG PO TABS: 750.0000 mg | ORAL_TABLET | Freq: Three times a day (TID) | ORAL | 0 refills | Status: DC | PRN

## 2019-01-11 MED ORDER — METHOCARBAMOL 500 MG PO TABS
750.0000 mg | ORAL_TABLET | Freq: Once | ORAL | Status: AC
  Administered 2019-01-11: 750 mg via ORAL
  Filled 2019-01-11: qty 2

## 2019-01-11 NOTE — ED Provider Notes (Signed)
[facility] EMERGENCY DEPARTMENT Provider Note   CSN: [id number] Arrival date & time: 01/11/19  1538     History   Chief Complaint Chief Complaint  Patient presents with  . Neck Pain  . Headache    HPI Latoya Schmidt is a 48 y.o. female.  Patient c/o pain to right posterior head for past few days. Pain constant, dull, mod-severe. Also states intermittently noted 'knot' at base of right skull/right upper neck. Pt denies head or neck injury or strain. Also notes bil eyes felt funny/'burning'. No eye redness or discharge. No eye pain or change in vision. Denies sinus drainage or congestion, no uri symptoms. No fever or chills. No extremity numbness/weakness. No change in speech/vision or loss or normal functional ability. Denies hx chronic headaches. No radicular pain or arm pain.   The history is provided by the patient.  Neck Pain  Associated symptoms: headaches   Associated symptoms: no chest pain, no fever, no numbness and no weakness   Headache  Associated symptoms: neck pain   Associated symptoms: no abdominal pain, no back pain, no congestion, no cough, no fever, no numbness, no vomiting and no weakness     Past Medical History:  Diagnosis Date  . Anxiety   . Asthma   . IBS (irritable bowel syndrome)   . Ovarian cyst     There are no active problems to display for this patient.   Past Surgical History:  Procedure Laterality Date  . CESAREAN SECTION    . CHOLECYSTECTOMY    . ERCP    . ERCP    . OVARIAN CYST REMOVAL    . TUBAL LIGATION       OB History   No obstetric history on file.      Home Medications    Prior to Admission medications   Medication Sig Start Date End Date Taking? Authorizing Provider  albuterol (PROVENTIL HFA;VENTOLIN HFA) 108 (90 Base) MCG/ACT inhaler Inhale 2 puffs into the lungs every 6 (six) hours as needed for wheezing or shortness of breath.    [provider]  Multiple Vitamin (MULITIVITAMIN WITH  MINERALS) TABS Take 1 tablet by mouth daily.    [provider]  pseudoephedrine (SUDAFED 12 HOUR) 120 MG 12 hr tablet Take 1 tablet (120 mg total) by mouth every 12 (twelve) hours. 12/31/16   Linwood Dibbles, MD  venlafaxine XR (EFFEXOR-XR) 75 MG 24 hr capsule Take by mouth. 01/02/18   [provider]    Family History No family history on file.  Social History Social History   Tobacco Use  . Smoking status: Never Smoker  . Smokeless tobacco: Never Used  Substance Use Topics  . Alcohol use: Yes    Comment: occasional  . Drug use: No     Allergies   Ivp dye [iodinated diagnostic agents]   Review of Systems Review of Systems  Constitutional: Negative for chills and fever.  HENT: Negative for congestion and sinus pain.   Eyes: Negative for redness.  Respiratory: Negative for cough and shortness of breath.   Cardiovascular: Negative for chest pain.  Gastrointestinal: Negative for abdominal pain and vomiting.  Genitourinary: Negative for flank pain.  Musculoskeletal: Positive for neck pain. Negative for back pain.  Skin: Negative for rash.  Neurological: Positive for headaches. Negative for weakness and numbness.  Hematological: Does not bruise/bleed easily.  Psychiatric/Behavioral: Negative for confusion.     Physical Exam Updated Vital Signs BP 140/87   Pulse 84  Temp 97.9 F (36.6 C) (Oral)   Resp 16   SpO2 100%   Physical Exam Vitals signs and nursing note reviewed.  Constitutional:      General: She is not in acute distress.    Appearance: Normal appearance. She is well-developed. She is not diaphoretic.  HENT:     Head: Atraumatic.     Comments: No trigger point felt in area where pt had noted knot.     Nose: Nose normal. No congestion or rhinorrhea.     Mouth/Throat:     Mouth: Mucous membranes are moist.  Eyes:     General: No scleral icterus.    Extraocular Movements: Extraocular movements intact.     Conjunctiva/sclera: Conjunctivae  normal.     Pupils: Pupils are equal, round, and reactive to light.  Neck:     Musculoskeletal: Normal range of motion and neck supple. No neck rigidity or muscular tenderness.     Thyroid: No thyromegaly.     Trachea: No tracheal deviation.     Comments: No stiffness or rigidity. No knot or trigger point felt. Spine non tender, aligned. Good rom.  Cardiovascular:     Rate and Rhythm: Normal rate and regular rhythm.     Pulses: Normal pulses.     Heart sounds: Normal heart sounds. No murmur. No friction rub. No gallop.   Pulmonary:     Effort: Pulmonary effort is normal. No respiratory distress.     Breath sounds: Normal breath sounds.  Abdominal:     General: Bowel sounds are normal. There is no distension.     Palpations: Abdomen is soft.     Tenderness: There is no abdominal tenderness. There is no guarding.  Genitourinary:    Comments: No cva tenderness.  Musculoskeletal: Normal range of motion.        General: No swelling or tenderness.  Skin:    General: Skin is warm and dry.     Findings: No rash.  Neurological:     General: No focal deficit present.     Mental Status: She is alert and oriented to person, place, and time.     Cranial Nerves: No cranial nerve deficit.     Comments: Alert, speech normal. Motor intact bil, stre 5/5. No pronator drift. sens grossly intact. Steady gait.   Psychiatric:        Mood and Affect: Mood normal.      ED Treatments / Results  Labs (all labs ordered are listed, but only abnormal results are displayed) Results for orders placed or performed during the hospital encounter of 01/11/19  Comprehensive metabolic panel  Result Value Ref Range   Sodium 140 135 - 145 mmol/L   Potassium 3.7 3.5 - 5.1 mmol/L   Chloride 104 98 - 111 mmol/L   CO2 27 22 - 32 mmol/L   Glucose, Bld 94 70 - 99 mg/dL   BUN 12 6 - 20 mg/dL   Creatinine, Ser 1.910.84 0.44 - 1.00 mg/dL   Calcium 9.5 8.9 - 47.810.3 mg/dL   Total Protein 7.7 6.5 - 8.1 g/dL   Albumin 4.1  3.5 - 5.0 g/dL   AST 20 15 - 41 U/L   ALT 21 0 - 44 U/L   Alkaline Phosphatase 41 38 - 126 U/L   Total Bilirubin 0.5 0.3 - 1.2 mg/dL   GFR calc non Af Amer >60 >60 mL/min   GFR calc Af Amer >60 >60 mL/min   Anion gap 9 5 - 15  CBC with Differential  Result Value Ref Range   WBC 6.4 4.0 - 10.5 K/uL   RBC 5.05 3.87 - 5.11 MIL/uL   Hemoglobin 14.0 12.0 - 15.0 g/dL   HCT 26.9 48.5 - 46.2 %   MCV 87.3 80.0 - 100.0 fL   MCH 27.7 26.0 - 34.0 pg   MCHC 31.7 30.0 - 36.0 g/dL   RDW 70.3 50.0 - 93.8 %   Platelets 294 150 - 400 K/uL   nRBC 0.0 0.0 - 0.2 %   Neutrophils Relative % 66 %   Neutro Abs 4.3 1.7 - 7.7 K/uL   Lymphocytes Relative 27 %   Lymphs Abs 1.7 0.7 - 4.0 K/uL   Monocytes Relative 6 %   Monocytes Absolute 0.4 0.1 - 1.0 K/uL   Eosinophils Relative 0 %   Eosinophils Absolute 0.0 0.0 - 0.5 K/uL   Basophils Relative 0 %   Basophils Absolute 0.0 0.0 - 0.1 K/uL   Immature Granulocytes 1 %   Abs Immature Granulocytes 0.03 0.00 - 0.07 K/uL  I-Stat beta hCG blood, ED  Result Value Ref Range   I-stat hCG, quantitative <5.0 <5 mIU/mL   Comment 3            EKG None  Radiology Ct Head Wo Contrast  Result Date: 01/11/2019 CLINICAL DATA:  48 y/o F; intermittent headache and palpable mass to the posterior portion of the head for 4 days. EXAM: CT HEAD WITHOUT CONTRAST TECHNIQUE: Contiguous axial images were obtained from the base of the skull through the vertex without intravenous contrast. COMPARISON:  07/26/2015 CT head FINDINGS: Brain: No evidence of acute infarction, hemorrhage, hydrocephalus, extra-axial collection or mass lesion/mass effect. Vascular: No hyperdense vessel or unexpected calcification. Skull: Stable left posterior paramedian soft tissue thickening with retraction of the dermis, probably dermal appendage cyst or scar. No calvarial fracture. Sinuses/Orbits: No acute finding. Other: None. IMPRESSION: No acute intracranial abnormality. Unremarkable CT of the brain.  Stable left posterior paramedian scalp soft tissue thickening with dermal retraction, probably dermal appendage cyst or scar. No new scalp mass or collection identified. Electronically Signed   By: Mitzi Hansen M.D.   On: 01/11/2019 23:00    Procedures Procedures (including critical care time)  Medications Ordered in ED Medications  ibuprofen (ADVIL,MOTRIN) tablet 400 mg (has no administration in time range)  methocarbamol (ROBAXIN) tablet 750 mg (has no administration in time range)     Initial Impression / Assessment and Plan / ED Course  I have reviewed the triage vital signs and the nursing notes.  Pertinent labs & imaging results that were available during my care of the patient were reviewed by me and considered in my medical decision making (see chart for details).  Motrin po. Robaxin po.  Imaging.  Labs reviewed - chem normal.   Reviewed nursing notes and prior charts for additional history.   Ct reviewed - neg acute.   Pt with non focal neuro exam, afebrile, and currently appears stable for d/c.     Final Clinical Impressions(s) / ED Diagnoses   Final diagnoses:  None    ED Discharge Orders    None       Cathren Laine, MD 01/11/19 2306

## 2019-01-11 NOTE — ED Triage Notes (Signed)
Pt endorses neck pain, headache and right eye pressure intermittently x 1 week. Pt feels weak today. VSS

## 2019-01-11 NOTE — ED Notes (Signed)
Patient transported to CT 

## 2019-01-11 NOTE — Discharge Instructions (Addendum)
It was our pleasure to provide your ER care today - we hope that you feel better.  Your lab work looks good/normal, and your head CT scan was read by our radiologist as showing no acute process.   Try heat therapy and/or gentle massage to sore area.  Take motrin or aleve as need for pain.  You may also take robaxin as need for muscular pain/spasm - no driving when taking.  Follow up with primary care doctor in the coming week.  Return to ER if worse, new symptoms, high fevers, worsening or intractable pain, other concern.

## 2019-01-11 NOTE — ED Notes (Signed)
Patient verbalizes understanding of discharge instructions. Opportunity for questioning and answers were provided. Armband removed by staff, pt discharged from ED ambulatory. Stated friend can drive home since medications given.

## 2019-01-12 DIAGNOSIS — R030 Elevated blood-pressure reading, without diagnosis of hypertension: Secondary | ICD-10-CM | POA: Insufficient documentation

## 2019-01-30 ENCOUNTER — Ambulatory Visit (INDEPENDENT_AMBULATORY_CARE_PROVIDER_SITE_OTHER): Payer: Self-pay | Admitting: Cardiology

## 2019-01-30 ENCOUNTER — Encounter: Payer: Self-pay | Admitting: Cardiology

## 2019-01-30 VITALS — BP 118/68 | HR 79 | Ht 66.0 in | Wt 177.1 lb

## 2019-01-30 DIAGNOSIS — R030 Elevated blood-pressure reading, without diagnosis of hypertension: Secondary | ICD-10-CM

## 2019-01-30 DIAGNOSIS — R0789 Other chest pain: Secondary | ICD-10-CM

## 2019-01-30 DIAGNOSIS — E782 Mixed hyperlipidemia: Secondary | ICD-10-CM

## 2019-01-30 DIAGNOSIS — R079 Chest pain, unspecified: Secondary | ICD-10-CM

## 2019-01-30 NOTE — Patient Instructions (Addendum)
Medication Instructions:  Your physician recommends that you continue on your current medications as directed. Please refer to the Current Medication list given to you today.  If you need a refill on your cardiac medications before your next appointment, please call your pharmacy.   Lab work: Your physician recommends that you return for lab work 1 week before your 6 month follow up appointment: BMP, TSH, hepatic function panel, lipid panel. Please return to our office for lab work, no appointment needed. Please fast beforehand.   If you have labs (blood work) drawn today and your tests are completely normal, you will receive your results only by: Marland Kitchen MyChart Message (if you have MyChart) OR . A paper copy in the mail If you have any lab test that is abnormal or we need to change your treatment, we will call you to review the results.  Testing/Procedures: You had an EKG today.   Your physician has requested that you have a stress echocardiogram. For further information please visit [email or web address] Please follow instruction sheet as given.  You will have the CT cardiac scoring at [address] [place], [place] on Friday, 02/02/2019 at 10:45 am. Please arrive at 10:30 am. This test is not covered by insurance and will cost $150 out of pocket.   Follow-Up: At [facility], you and your health needs are our priority.  As part of our continuing mission to provide you with exceptional heart care, we have created designated Provider Care Teams.  These Care Teams include your primary Cardiologist (physician) and Advanced Practice Providers (APPs -  Physician Assistants and Nurse Practitioners) who all work together to provide you with the care you need, when you need it. You will need a follow up appointment in 6 months.  Please call our office 2 months in advance to schedule this appointment.      Exercise Stress Test An exercise stress test is a test to check how your heart  works during exercise. You will need to walk on a treadmill or ride an exercise bike for this test. An electrocardiogram (ECG) will record your heartbeat when you are at rest and when you are exercising. You may have an ultrasound or nuclear test after the exercise test. The test is done to check for coronary artery disease (CAD). It is also done to:  See how well you can exercise.  Watch for high blood pressure during exercise.  Test how well you can exercise after treatment.  Check the blood flow to your arms and legs. If your test result is not normal, more testing may be needed. What happens before the procedure?  Follow instructions from your doctor about what you cannot eat or drink. ? Do not have any drinks or foods that have caffeine in them for 24 hours before the test, or as told by your doctor. This includes coffee, tea (even decaf tea), sodas, chocolate, and cocoa.  Ask your doctor about changing or stopping your normal medicines. This is important if you: ? Take diabetes medicines. ? Take beta-blocker medicines. ? Wear a nitroglycerin patch.  If you use an inhaler, bring it with you to the test.  Do not put lotions, powders, creams, or oils on your chest before the test.  Wear comfortable shoes and clothing.  Do not use any products that have nicotine or tobacco in them, such as cigarettes and e-cigarettes. Stop using them at least 4 hours before the test. If you need help quitting, ask your  doctor. What happens during the procedure?  Patches (electrodes) will be put on your chest.  Wires will be connected to the patches. The wires will send signals to a machine to record your heartbeat.  Your heart rate will be watched while you are resting and while you are exercising. Your blood pressure will also be watched during the test.  You will walk on a treadmill or use a stationary bike. If you cannot use these, you may be asked to turn a crank with your hands.  The  activity will get harder and will raise your heart rate.  You may be asked to breathe into a tube a few times during the test. This measures the gases that you breathe out.  You will be asked how you are feeling throughout the test.  You will exercise until your heart reaches a target heart rate. You will stop early if: ? You feel dizzy. ? You have chest pain. ? You are out of breath. ? Your blood pressure is too high or too low. ? You have an irregular heartbeat. ? You have pain or aching in your arms or legs. The procedure may vary among doctors and hospitals. What happens after the procedure?  Your blood pressure, heart rate, breathing rate, and blood oxygen level will be watched after the test.  You may return to your normal diet and activities as told by your doctor.  It is up to you to get the results of your test. Ask your doctor, or the department that is doing the test, when your results will be ready. Summary  An exercise stress test is a test to check how your heart works during exercise.  This test is done to check for coronary artery disease.  Your heart rate will be watched while you are resting and while you are exercising.  Follow instructions from your doctor about what you cannot eat or drink before the test. This information is not intended to replace advice given to you by your health care provider. Make sure you discuss any questions you have with your health care provider. Document Released: 05/31/2008 Document Revised: 03/15/2017 Document Reviewed: 03/15/2017 Elsevier Interactive Patient Education  2019 Elsevier Inc.    Coronary Calcium Scan A coronary calcium scan is an imaging test used to look for deposits of calcium and other fatty materials (plaques) in the inner lining of the blood vessels of the heart (coronary arteries). These deposits of calcium and plaques can partly clog and narrow the coronary arteries without producing any symptoms or warning  signs. This puts a person at risk for a heart attack. This test can detect these deposits before symptoms develop. Tell a health care provider about:  Any allergies you have.  All medicines you are taking, including vitamins, herbs, eye drops, creams, and over-the-counter medicines.  Any problems you or family members have had with anesthetic medicines.  Any blood disorders you have.  Any surgeries you have had.  Any medical conditions you have.  Whether you are pregnant or may be pregnant. What are the risks? Generally, this is a safe procedure. However, problems may occur, including:  Harm to a pregnant woman and her unborn baby. This test involves the use of radiation. Radiation exposure can be dangerous to a pregnant woman and her unborn baby. If you are pregnant, you generally should not have this procedure done.  Slight increase in the risk of cancer. This is because of the radiation involved in the test. What  happens before the procedure? No preparation is needed for this procedure. What happens during the procedure?   You will undress and remove any jewelry around your neck or chest.  You will put on a hospital gown.  Sticky electrodes will be placed on your chest. The electrodes will be connected to an electrocardiogram (ECG) machine to record a tracing of the electrical activity of your heart.  A CT scanner will take pictures of your heart. During this time, you will be asked to lie still and hold your breath for 2-3 seconds while a picture of your heart is being taken. The procedure may vary among health care providers and hospitals. What happens after the procedure?  You can get dressed.  You can return to your normal activities.  It is up to you to get the results of your test. Ask your health care provider, or the department that is doing the test, when your results will be ready. Summary  A coronary calcium scan is an imaging test used to look for deposits  of calcium and other fatty materials (plaques) in the inner lining of the blood vessels of the heart (coronary arteries).  Generally, this is a safe procedure. Tell your health care provider if you are pregnant or may be pregnant.  No preparation is needed for this procedure.  A CT scanner will take pictures of your heart.  You can return to your normal activities after the scan is done. This information is not intended to replace advice given to you by your health care provider. Make sure you discuss any questions you have with your health care provider. Document Released: 06/10/2008 Document Revised: 11/01/2016 Document Reviewed: 11/01/2016 Elsevier Interactive Patient Education  2019 [employer]

## 2019-01-30 NOTE — Progress Notes (Signed)
Cardiology Office Note:    Date:  01/30/2019   ID:  Latoya Schmidt, DOB 01/12/1971, MRN [id number]  PCP:  [facility]  Cardiologist:  Garwin Brothers, MD   Referring MD: [facility]    ASSESSMENT:    1. Chest discomfort   2. Elevated BP without diagnosis of hypertension   3. Mixed dyslipidemia    PLAN:    In order of problems listed above:  1. Primary prevention stressed with the patient.  Importance of compliance with diet and medication stressed and she vocalized understanding.  Weight reduction was stressed. 2. Her blood pressure is stable.  Diet was discussed for dyslipidemia.  Weight reduction was stressed.  Her LDL was 137 on the paper she provided me. 3. In view of chest discomfort symptoms she will have exercise stress echo.  Her chest discomfort is atypical for coronary etiology.  It does not occur with exertion or not related to neck or to the arms or any such radiation. 4. I told her to refrain from using marijuana or any such related substances and that it had the potential of causing spasm. 5. I discussed with her about calcium score and she is agreeable. 6. Patient will be seen in follow-up appointment in 6 months or earlier if the patient has any concerns    Medication Adjustments/Labs and Tests Ordered: Current medicines are reviewed at length with the patient today.  Concerns regarding medicines are outlined above.  No orders of the defined types were placed in this encounter.  No orders of the defined types were placed in this encounter.    History of Present Illness:    Latoya Schmidt is a 48 y.o. female who is being seen today for the evaluation of chest discomfort at the request of [facility]  Patient is a pleasant 48 year old female.  She has past medical history of bronchial asthma.  She mentions to me that she inhales marijuana at times.  Occasionally she will have chest tightness during these  times.  No orthopnea or PND.  She leads a sedentary lifestyle.  At the time of my evaluation, the patient is alert awake oriented and in no distress.  She is concerned about the symptoms and also the fact that she recently had high blood pressure and was evaluated by her primary care physician and in the emergency room.  Interestingly she tells me that she had pain in the neck when this happened and a complete evaluation was negative.  Past Medical History:  Diagnosis Date  . Anxiety   . Asthma   . IBS (irritable bowel syndrome)   . Ovarian cyst     Past Surgical History:  Procedure Laterality Date  . CESAREAN SECTION    . CHOLECYSTECTOMY    . ERCP    . ERCP    . OVARIAN CYST REMOVAL    . TUBAL LIGATION      Current Medications: Current Meds  Medication Sig  . albuterol (PROVENTIL HFA;VENTOLIN HFA) 108 (90 Base) MCG/ACT inhaler Inhale 2 puffs into the lungs every 6 (six) hours as needed for wheezing or shortness of breath.  . escitalopram (LEXAPRO) 10 MG tablet Take 1 tablet by mouth daily.  . methocarbamol (ROBAXIN) 750 MG tablet Take 1 tablet (750 mg total) by mouth 3 (three) times daily as needed (muscle spasm/pain).  . Multiple Vitamin (MULITIVITAMIN WITH MINERALS) TABS Take 1 tablet by mouth daily.  . pseudoephedrine (SUDAFED 12 HOUR) 120 MG 12 hr  tablet Take 1 tablet (120 mg total) by mouth every 12 (twelve) hours.     Allergies:   Iodine-131; Nitrofurantoin macrocrystal; and Ivp dye [iodinated diagnostic agents]   Social History   Socioeconomic History  . Marital status: Divorced    Spouse name: Not on file  . Number of children: Not on file  . Years of education: Not on file  . Highest education level: Not on file  Occupational History  . Not on file  Social Needs  . Financial resource strain: Not on file  . Food insecurity:    Worry: Not on file    Inability: Not on file  . Transportation needs:    Medical: Not on file    Non-medical: Not on file  Tobacco  Use  . Smoking status: Never Smoker  . Smokeless tobacco: Never Used  Substance and Sexual Activity  . Alcohol use: Yes    Comment: occasional  . Drug use: No  . Sexual activity: Yes    Birth control/protection: Surgical  Lifestyle  . Physical activity:    Days per week: Not on file    Minutes per session: Not on file  . Stress: Not on file  Relationships  . Social connections:    Talks on phone: Not on file    Gets together: Not on file    Attends religious service: Not on file    Active member of club or organization: Not on file    Attends meetings of clubs or organizations: Not on file    Relationship status: Not on file  Other Topics Concern  . Not on file  Social History Narrative  . Not on file     Family History: The patient's family history is not on file.  ROS:   Please see the history of present illness.    All other systems reviewed and are negative.  EKGs/Labs/Other Studies Reviewed:    The following studies were reviewed today: EKG reveals sinus rhythm and nonspecific ST-T changes.   Recent Labs: 01/11/2019: ALT 21; BUN 12; Creatinine, Ser 0.84; Hemoglobin 14.0; Platelets 294; Potassium 3.7; Sodium 140  Recent Lipid Panel No results found for: CHOL, TRIG, HDL, CHOLHDL, VLDL, LDLCALC, LDLDIRECT  Physical Exam:    VS:  BP 118/68 (BP Location: Right Arm, Patient Position: Sitting, Cuff Size: Normal)   Pulse 79   Ht 5\' 6"  (1.676 m)   Wt 177 lb 1.9 oz (80.3 kg)   SpO2 99%   BMI 28.59 kg/m     Wt Readings from Last 3 Encounters:  01/30/19 177 lb 1.9 oz (80.3 kg)  08/23/18 174 lb (78.9 kg)  07/04/18 174 lb (78.9 kg)     GEN: Patient is in no acute distress HEENT: Normal NECK: No JVD; No carotid bruits LYMPHATICS: No lymphadenopathy CARDIAC: S1 S2 regular, 2/6 systolic murmur at the apex. RESPIRATORY:  Clear to auscultation without rales, wheezing or rhonchi  ABDOMEN: Soft, non-tender, non-distended MUSCULOSKELETAL:  No edema; No deformity    SKIN: Warm and dry NEUROLOGIC:  Alert and oriented x 3 PSYCHIATRIC:  Normal affect    Signed, Garwin Brothersajan R Xzavien Harada, MD  01/30/2019 3:35 PM    [facility]

## 2019-02-02 ENCOUNTER — Telehealth: Payer: Self-pay | Admitting: Cardiology

## 2019-02-02 ENCOUNTER — Ambulatory Visit (INDEPENDENT_AMBULATORY_CARE_PROVIDER_SITE_OTHER)
Admission: RE | Admit: 2019-02-02 | Discharge: 2019-02-02 | Disposition: A | Discharge status: Home / Self Care | Payer: Self-pay | Source: Ambulatory Visit | Attending: Cardiology | Admitting: Cardiology

## 2019-02-02 DIAGNOSIS — R079 Chest pain, unspecified: Secondary | ICD-10-CM

## 2019-02-02 NOTE — Telephone Encounter (Signed)
Re informed patient of ct calcium score results.She verbally understands and has not further questions

## 2019-02-05 ENCOUNTER — Ambulatory Visit (HOSPITAL_BASED_OUTPATIENT_CLINIC_OR_DEPARTMENT_OTHER)
Admission: RE | Admit: 2019-02-05 | Discharge: 2019-02-05 | Disposition: A | Discharge status: Home / Self Care | Payer: Self-pay | Source: Ambulatory Visit | Attending: Cardiology | Admitting: Cardiology

## 2019-02-05 DIAGNOSIS — R0789 Other chest pain: Secondary | ICD-10-CM | POA: Insufficient documentation

## 2019-02-05 DIAGNOSIS — R030 Elevated blood-pressure reading, without diagnosis of hypertension: Secondary | ICD-10-CM | POA: Insufficient documentation

## 2019-02-05 NOTE — Progress Notes (Signed)
  Echocardiogram Echocardiogram Stress Test has been performed.  Latoya Schmidt [name] 02/05/2019, 9:47 AM

## 2019-05-17 ENCOUNTER — Emergency Department (HOSPITAL_BASED_OUTPATIENT_CLINIC_OR_DEPARTMENT_OTHER)
Admission: EM | Admit: 2019-05-17 | Discharge: 2019-05-17 | Disposition: A | Discharge status: Home / Self Care | Payer: Self-pay | Attending: Emergency Medicine | Admitting: Emergency Medicine

## 2019-05-17 ENCOUNTER — Other Ambulatory Visit: Payer: Self-pay

## 2019-05-17 ENCOUNTER — Encounter (HOSPITAL_BASED_OUTPATIENT_CLINIC_OR_DEPARTMENT_OTHER): Payer: Self-pay

## 2019-05-17 DIAGNOSIS — W5551XA Bitten by raccoon, initial encounter: Secondary | ICD-10-CM | POA: Insufficient documentation

## 2019-05-17 DIAGNOSIS — S60511A Abrasion of right hand, initial encounter: Secondary | ICD-10-CM | POA: Insufficient documentation

## 2019-05-17 DIAGNOSIS — Y999 Unspecified external cause status: Secondary | ICD-10-CM | POA: Insufficient documentation

## 2019-05-17 DIAGNOSIS — Z2914 Encounter for prophylactic rabies immune globin: Secondary | ICD-10-CM | POA: Insufficient documentation

## 2019-05-17 DIAGNOSIS — Y929 Unspecified place or not applicable: Secondary | ICD-10-CM | POA: Insufficient documentation

## 2019-05-17 DIAGNOSIS — Z79899 Other long term (current) drug therapy: Secondary | ICD-10-CM | POA: Insufficient documentation

## 2019-05-17 DIAGNOSIS — J45909 Unspecified asthma, uncomplicated: Secondary | ICD-10-CM | POA: Insufficient documentation

## 2019-05-17 DIAGNOSIS — S60512A Abrasion of left hand, initial encounter: Secondary | ICD-10-CM

## 2019-05-17 DIAGNOSIS — F909 Attention-deficit hyperactivity disorder, unspecified type: Secondary | ICD-10-CM | POA: Insufficient documentation

## 2019-05-17 DIAGNOSIS — Y9389 Activity, other specified: Secondary | ICD-10-CM | POA: Insufficient documentation

## 2019-05-17 DIAGNOSIS — Z23 Encounter for immunization: Secondary | ICD-10-CM | POA: Insufficient documentation

## 2019-05-17 MED ORDER — TETANUS-DIPHTH-ACELL PERTUSSIS 5-2.5-18.5 LF-MCG/0.5 IM SUSP
0.5000 mL | Freq: Once | INTRAMUSCULAR | Status: AC
  Administered 2019-05-17: 0.5 mL via INTRAMUSCULAR
  Filled 2019-05-17: qty 0.5

## 2019-05-17 MED ORDER — RABIES IMMUNE GLOBULIN 150 UNIT/ML IM INJ
20.0000 [IU]/kg | INJECTION | Freq: Once | INTRAMUSCULAR | Status: AC
  Administered 2019-05-17: 1575 [IU] via INTRAMUSCULAR
  Filled 2019-05-17: qty 10

## 2019-05-17 MED ORDER — RABIES VACCINE, PCEC IM SUSR
1.0000 mL | Freq: Once | INTRAMUSCULAR | Status: AC
  Administered 2019-05-17: 16:00:00 1 mL via INTRAMUSCULAR
  Filled 2019-05-17: qty 1

## 2019-05-17 NOTE — Discharge Instructions (Addendum)
Follow injection series letter for further vaccinations.

## 2019-05-17 NOTE — ED Notes (Signed)
ED Provider at bedside. 

## 2019-05-17 NOTE — ED Provider Notes (Signed)
[facility] EMERGENCY DEPARTMENT Provider Note   CSN: [id number] Arrival date & time: 05/17/19  1316    History   Chief Complaint Chief Complaint  Patient presents with   Animal Bite    HPI Maleaha Obryant is a 48 y.o. female.     48 year old female presents with complaint of a scratch to her left hand from a baby raccoon.  Patient states that she found the baby raccoon yesterday, picked it up and it scratched her hand.  Steffanie Dunn is currently in the care of a rescue organization.  Patient called her PCP office and was sent to the ER for possible rabies vaccines.     Past Medical History:  Diagnosis Date   Anxiety    Asthma    IBS (irritable bowel syndrome)    Ovarian cyst     Patient Active Problem List   Diagnosis Date Noted   Chest discomfort 01/30/2019   Mixed dyslipidemia 01/30/2019   Elevated BP without diagnosis of hypertension 01/12/2019   Breast mass, right 09/11/2018   Breast pain, left 08/30/2018   Pap smear of cervix shows high risk HPV present 08/22/2017   Current moderate episode of major depressive disorder without prior episode (HCC) 07/29/2017   Low grade squamous intraepithelial lesion (LGSIL) on Papanicolaou smear of cervix 07/29/2017   ADD (attention deficit disorder) 12/18/2015   Asthma 12/18/2015   Generalized anxiety disorder 12/18/2015   IBS (irritable bowel syndrome) 12/18/2015    Past Surgical History:  Procedure Laterality Date   CESAREAN SECTION     CHOLECYSTECTOMY     ERCP     ERCP     OVARIAN CYST REMOVAL     TUBAL LIGATION       OB History   No obstetric history on file.      Home Medications    Prior to Admission medications   Medication Sig Start Date End Date Taking? Authorizing Provider  albuterol (PROVENTIL HFA;VENTOLIN HFA) 108 (90 Base) MCG/ACT inhaler Inhale 2 puffs into the lungs every 6 (six) hours as needed for wheezing or shortness of breath.    [provider]    escitalopram (LEXAPRO) 10 MG tablet Take 1 tablet by mouth daily. 01/12/19 02/11/19  [provider]  methocarbamol (ROBAXIN) 750 MG tablet Take 1 tablet (750 mg total) by mouth 3 (three) times daily as needed (muscle spasm/pain). 01/11/19   Cathren Laine, MD  Multiple Vitamin (MULITIVITAMIN WITH MINERALS) TABS Take 1 tablet by mouth daily.    [provider]  pseudoephedrine (SUDAFED 12 HOUR) 120 MG 12 hr tablet Take 1 tablet (120 mg total) by mouth every 12 (twelve) hours. 12/31/16   Linwood Dibbles, MD    Family History No family history on file.  Social History Social History   Tobacco Use   Smoking status: Never Smoker   Smokeless tobacco: Never Used  Substance Use Topics   Alcohol use: Yes    Comment: occasional   Drug use: No     Allergies   Iodine-131; Nitrofurantoin macrocrystal; and Ivp dye [iodinated diagnostic agents]   Review of Systems Review of Systems  Constitutional: Negative for fever.  Musculoskeletal: Negative for arthralgias and myalgias.  Skin: Positive for wound.  Allergic/Immunologic: Negative for immunocompromised state.  Hematological: Negative for adenopathy. Does not bruise/bleed easily.  All other systems reviewed and are negative.    Physical Exam Updated Vital Signs BP (!) 124/91 (BP Location: Left Arm)    Pulse 85    Temp 98.4 F (  36.9 C) (Oral)    Resp 16    Ht 5\' 5"  (1.651 m)    Wt 78.5 kg    SpO2 100%    BMI 28.79 kg/m   Physical Exam Vitals signs and nursing note reviewed.  Constitutional:      General: She is not in acute distress.    Appearance: She is well-developed. She is not diaphoretic.  HENT:     Head: Normocephalic and atraumatic.  Pulmonary:     Effort: Pulmonary effort is normal.  Musculoskeletal:        General: No swelling or deformity.       Hands:  Skin:    General: Skin is warm and dry.     Findings: No erythema or rash.  Neurological:     Mental Status: She is alert and oriented to person,  place, and time.  Psychiatric:        Behavior: Behavior normal.      ED Treatments / Results  Labs (all labs ordered are listed, but only abnormal results are displayed) Labs Reviewed - No data to display  EKG None  Radiology No results found.  Procedures Procedures (including critical care time)  Medications Ordered in ED Medications  rabies immune globulin (HYPERAB/KEDRAB) injection 1,575 Units (1,575 Units Intramuscular Given 05/17/19 1608)  rabies vaccine (RABAVERT) injection 1 mL (1 mL Intramuscular Given 05/17/19 1604)  Tdap (BOOSTRIX) injection 0.5 mL (0.5 mLs Intramuscular Given 05/17/19 1600)     Initial Impression / Assessment and Plan / ED Course  I have reviewed the triage vital signs and the nursing notes.  Pertinent labs & imaging results that were available during my care of the patient were reviewed by me and considered in my medical decision making (see chart for details).  Clinical Course as of May 16 1650  Thu May 17, 2019  [id number] 48 year old female presents with reported exposure to a baby raccoon which scratched her hand.  Patient has a pinpoint red dot along her first metacarpal, no other wounds to the hand noted.  Patient was uncertain if she wanted the rabies vaccine series versus having the animal quarantined and tested.  Patient discussed rabies vaccine concerns with staff pharmacist and has decided she wants to proceed with rabies vaccine series.  Rabies vaccine series initiated at patient's request.  Tetanus updated.  There is no obvious wound, I do not feel patient needs antibiotics at this time.  Patient to follow-up per rabies vaccine schedule.   [LM]    Clinical Course User Index [LM] Jeannie FendMurphy, Lasalle Abee A, PA-C      Final Clinical Impressions(s) / ED Diagnoses   Final diagnoses:  Abrasion of left hand, initial encounter    ED Discharge Orders    None       Alden HippMurphy, Labria Wos A, PA-C 05/17/19 1651    Linwood DibblesKnapp, Jon, MD 05/20/19 [phone]

## 2019-05-17 NOTE — ED Notes (Signed)
Pt crying hysterically.  Sts she doesn't want to take the rabies shots if she doesn't have to but she doesn't want to worry about getting rabies for the next 2 years.  And she doesn't want to worry about having harmful effects from the rabies shots for the rest of her life.  Pt has asked for a few minutes to make some phone calls before she makes a decision. I called the [employer] and they said this incident is not under their jurisdiction. Registration has now faxed the paper to the [facility] and we will call they when it goes through concerning quarantining the raccoon.

## 2019-05-17 NOTE — ED Triage Notes (Signed)
Pt states she was scratched by baby raccoon yesterday-sent by PCP for possible rabies series-NAD-steady gait

## 2019-05-22 ENCOUNTER — Ambulatory Visit (HOSPITAL_COMMUNITY)
Admission: EM | Admit: 2019-05-22 | Discharge: 2019-05-22 | Disposition: A | Discharge status: Home / Self Care | Payer: Self-pay | Attending: Family Medicine | Admitting: Family Medicine

## 2019-05-22 DIAGNOSIS — Z203 Contact with and (suspected) exposure to rabies: Secondary | ICD-10-CM

## 2019-05-22 DIAGNOSIS — Z23 Encounter for immunization: Secondary | ICD-10-CM

## 2019-05-22 MED ORDER — RABIES VACCINE, PCEC IM SUSR
INTRAMUSCULAR | Status: AC
  Filled 2019-05-22: qty 1

## 2019-05-22 MED ORDER — RABIES VACCINE, PCEC IM SUSR
1.0000 mL | Freq: Once | INTRAMUSCULAR | Status: AC
  Administered 2019-05-22: 1 mL via INTRAMUSCULAR

## 2019-05-22 NOTE — ED Notes (Signed)
Pt here for second rabies vaccine, is two days late. Vaccine schedule adjusted to reflect pt's new days to come for followup vaccination.

## 2019-05-26 ENCOUNTER — Ambulatory Visit (HOSPITAL_COMMUNITY)
Admission: EM | Admit: 2019-05-26 | Discharge: 2019-05-26 | Disposition: A | Discharge status: Home / Self Care | Payer: Self-pay | Attending: Family Medicine | Admitting: Family Medicine

## 2019-05-26 DIAGNOSIS — Z203 Contact with and (suspected) exposure to rabies: Secondary | ICD-10-CM

## 2019-05-26 DIAGNOSIS — Z23 Encounter for immunization: Secondary | ICD-10-CM

## 2019-05-26 MED ORDER — RABIES VACCINE, PCEC IM SUSR
1.0000 mL | Freq: Once | INTRAMUSCULAR | Status: AC
  Administered 2019-05-26: 1 mL via INTRAMUSCULAR

## 2019-05-26 MED ORDER — RABIES VACCINE, PCEC IM SUSR
INTRAMUSCULAR | Status: AC
  Filled 2019-05-26: qty 1

## 2019-06-02 ENCOUNTER — Other Ambulatory Visit: Payer: Self-pay

## 2019-06-02 ENCOUNTER — Encounter (HOSPITAL_COMMUNITY): Payer: Self-pay | Admitting: Emergency Medicine

## 2019-06-02 ENCOUNTER — Ambulatory Visit (HOSPITAL_COMMUNITY)
Admission: EM | Admit: 2019-06-02 | Discharge: 2019-06-02 | Disposition: A | Discharge status: Home / Self Care | Payer: Self-pay | Attending: Internal Medicine | Admitting: Internal Medicine

## 2019-06-02 DIAGNOSIS — Z203 Contact with and (suspected) exposure to rabies: Secondary | ICD-10-CM

## 2019-06-02 DIAGNOSIS — Z23 Encounter for immunization: Secondary | ICD-10-CM

## 2019-06-02 MED ORDER — RABIES VACCINE, PCEC IM SUSR
INTRAMUSCULAR | Status: AC
  Filled 2019-06-02: qty 1

## 2019-06-02 MED ORDER — RABIES VACCINE, PCEC IM SUSR
1.0000 mL | Freq: Once | INTRAMUSCULAR | Status: AC
  Administered 2019-06-02: 1 mL via INTRAMUSCULAR

## 2019-06-02 NOTE — ED Triage Notes (Signed)
Patient is in department for rabies injection 

## 2019-06-02 NOTE — Discharge Instructions (Signed)
Return as needed

## 2019-07-05 ENCOUNTER — Ambulatory Visit: Payer: Self-pay | Admitting: Cardiology

## 2019-07-06 ENCOUNTER — Ambulatory Visit: Payer: Self-pay | Admitting: Cardiology

## 2019-08-28 ENCOUNTER — Encounter (HOSPITAL_BASED_OUTPATIENT_CLINIC_OR_DEPARTMENT_OTHER): Payer: Self-pay

## 2019-08-28 ENCOUNTER — Other Ambulatory Visit: Payer: Self-pay

## 2019-08-28 ENCOUNTER — Emergency Department (HOSPITAL_BASED_OUTPATIENT_CLINIC_OR_DEPARTMENT_OTHER): Payer: Self-pay

## 2019-08-28 ENCOUNTER — Emergency Department (HOSPITAL_BASED_OUTPATIENT_CLINIC_OR_DEPARTMENT_OTHER)
Admission: EM | Admit: 2019-08-28 | Discharge: 2019-08-28 | Disposition: A | Discharge status: Home / Self Care | Payer: Self-pay | Attending: Emergency Medicine | Admitting: Emergency Medicine

## 2019-08-28 DIAGNOSIS — K5900 Constipation, unspecified: Secondary | ICD-10-CM | POA: Insufficient documentation

## 2019-08-28 DIAGNOSIS — Z79899 Other long term (current) drug therapy: Secondary | ICD-10-CM | POA: Insufficient documentation

## 2019-08-28 DIAGNOSIS — J45909 Unspecified asthma, uncomplicated: Secondary | ICD-10-CM | POA: Insufficient documentation

## 2019-08-28 DIAGNOSIS — R110 Nausea: Secondary | ICD-10-CM | POA: Insufficient documentation

## 2019-08-28 DIAGNOSIS — R1084 Generalized abdominal pain: Secondary | ICD-10-CM | POA: Insufficient documentation

## 2019-08-28 LAB — CBC WITH DIFFERENTIAL/PLATELET
Abs Immature Granulocytes: 0.03 10*3/uL (ref 0.00–0.07)
Basophils Absolute: 0 10*3/uL (ref 0.0–0.1)
Basophils Relative: 0 %
Eosinophils Absolute: 0 10*3/uL (ref 0.0–0.5)
Eosinophils Relative: 0 %
HCT: 43.4 % (ref 36.0–46.0)
Hemoglobin: 13.8 g/dL (ref 12.0–15.0)
Immature Granulocytes: 0 %
Lymphocytes Relative: 32 %
Lymphs Abs: 2.3 10*3/uL (ref 0.7–4.0)
MCH: 28.2 pg (ref 26.0–34.0)
MCHC: 31.8 g/dL (ref 30.0–36.0)
MCV: 88.6 fL (ref 80.0–100.0)
Monocytes Absolute: 0.5 10*3/uL (ref 0.1–1.0)
Monocytes Relative: 7 %
Neutro Abs: 4.3 10*3/uL (ref 1.7–7.7)
Neutrophils Relative %: 61 %
Platelets: 265 10*3/uL (ref 150–400)
RBC: 4.9 MIL/uL (ref 3.87–5.11)
RDW: 12.6 % (ref 11.5–15.5)
WBC: 7.2 10*3/uL (ref 4.0–10.5)
nRBC: 0 % (ref 0.0–0.2)

## 2019-08-28 LAB — URINALYSIS, ROUTINE W REFLEX MICROSCOPIC
Bilirubin Urine: NEGATIVE
Glucose, UA: NEGATIVE mg/dL
Hgb urine dipstick: NEGATIVE
Ketones, ur: NEGATIVE mg/dL
Leukocytes,Ua: NEGATIVE
Nitrite: NEGATIVE
Protein, ur: NEGATIVE mg/dL
Specific Gravity, Urine: >1.03 — ABNORMAL HIGH (ref 1.005–1.030)
pH: 5.5 (ref 5.0–8.0)

## 2019-08-28 LAB — COMPREHENSIVE METABOLIC PANEL
ALT: 25 U/L (ref 0–44)
AST: 21 U/L (ref 15–41)
Albumin: 4 g/dL (ref 3.5–5.0)
Alkaline Phosphatase: 39 U/L (ref 38–126)
Anion gap: 8 (ref 5–15)
BUN: 20 mg/dL (ref 6–20)
CO2: 24 mmol/L (ref 22–32)
Calcium: 9.1 mg/dL (ref 8.9–10.3)
Chloride: 104 mmol/L (ref 98–111)
Creatinine, Ser: 0.82 mg/dL (ref 0.44–1.00)
GFR calc Af Amer: >60 mL/min (ref >60)
GFR calc non Af Amer: >60 mL/min (ref >60)
Glucose, Bld: 98 mg/dL (ref 70–99)
Potassium: 4 mmol/L (ref 3.5–5.1)
Sodium: 136 mmol/L (ref 135–145)
Total Bilirubin: 0.3 mg/dL (ref 0.3–1.2)
Total Protein: 7.2 g/dL (ref 6.5–8.1)

## 2019-08-28 LAB — LIPASE, BLOOD: Lipase: 30 U/L (ref 11–51)

## 2019-08-28 LAB — PREGNANCY, URINE: Preg Test, Ur: NEGATIVE

## 2019-08-28 MED ORDER — SODIUM CHLORIDE 0.9 % IV BOLUS
1000.0000 mL | Freq: Once | INTRAVENOUS | Status: AC
  Administered 2019-08-28: 1000 mL via INTRAVENOUS

## 2019-08-28 MED ORDER — ONDANSETRON 4 MG PO TBDP: 4.0000 mg | ORAL_TABLET | Freq: Three times a day (TID) | ORAL | 0 refills | Status: DC | PRN

## 2019-08-28 MED ORDER — MORPHINE SULFATE (PF) 4 MG/ML IV SOLN
4.0000 mg | Freq: Once | INTRAVENOUS | Status: DC
  Filled 2019-08-28: qty 1

## 2019-08-28 MED ORDER — LIDOCAINE VISCOUS HCL 2 % MT SOLN
15.0000 mL | Freq: Once | OROMUCOSAL | Status: AC
  Administered 2019-08-28: 15 mL via ORAL
  Filled 2019-08-28: qty 15

## 2019-08-28 MED ORDER — SUCRALFATE 1 GM/10ML PO SUSP: 1.0000 g | Freq: Three times a day (TID) | ORAL | 0 refills | Status: DC

## 2019-08-28 MED ORDER — ALUM & MAG HYDROXIDE-SIMETH 200-200-20 MG/5ML PO SUSP
30.0000 mL | Freq: Once | ORAL | Status: AC
  Administered 2019-08-28: 30 mL via ORAL
  Filled 2019-08-28: qty 30

## 2019-08-28 MED ORDER — ONDANSETRON HCL 4 MG/2ML IJ SOLN
4.0000 mg | Freq: Once | INTRAMUSCULAR | Status: AC
  Administered 2019-08-28: 4 mg via INTRAVENOUS
  Filled 2019-08-28: qty 2

## 2019-08-28 MED ORDER — DICYCLOMINE HCL 10 MG PO CAPS
20.0000 mg | ORAL_CAPSULE | Freq: Once | ORAL | Status: AC
  Administered 2019-08-28: 20 mg via ORAL
  Filled 2019-08-28: qty 2

## 2019-08-28 MED ORDER — OMEPRAZOLE 20 MG PO CPDR: 20.0000 mg | DELAYED_RELEASE_CAPSULE | Freq: Every day | ORAL | 0 refills | Status: DC

## 2019-08-28 MED ORDER — DICYCLOMINE HCL 20 MG PO TABS: 20.0000 mg | ORAL_TABLET | Freq: Three times a day (TID) | ORAL | 0 refills | Status: DC | PRN

## 2019-08-28 NOTE — ED Provider Notes (Signed)
[facility] EMERGENCY DEPARTMENT Provider Note   CSN: [id number] Arrival date & time: 08/28/19  1616     History   Chief Complaint Chief Complaint  Patient presents with  . Abdominal Pain    HPI Latoya Schmidt is a 48 y.o. female with a history of anxiety, asthma, IBS, ovarian cyst, and prior abdominal surgeries including cholecystectomy, C-section, and tubal ligation who presents to the emergency department with complaints of abdominal pain for the past 5 days.  Patient states she has a constant pressure type pain with intermittent increases in discomfort which occur 1-2 times per day.  She states the pain seems most prominent in the right upper quadrant initially but now seems more generalized, wrapping around to the left side.  No alleviating or aggravating factors to her pain.  She has had associated nausea without vomiting.  She states that she had issues with constipation for the past 1 month with small firm bowel movements therefore she took laxatives a couple days prior which led to some loose stool, but no change in her pain.  Her last bowel movement was earlier today and was fairly normal.  She denies fever, chills, emesis, melena, hematochezia, dysuria, vaginal bleeding, or vaginal discharge.  She has not necessarily had similar pain.     HPI  Past Medical History:  Diagnosis Date  . Anxiety   . Asthma   . IBS (irritable bowel syndrome)   . Ovarian cyst     Patient Active Problem List   Diagnosis Date Noted  . Chest discomfort 01/30/2019  . Mixed dyslipidemia 01/30/2019  . Elevated BP without diagnosis of hypertension 01/12/2019  . Breast mass, right 09/11/2018  . Breast pain, left 08/30/2018  . Pap smear of cervix shows high risk HPV present 08/22/2017  . Current moderate episode of major depressive disorder without prior episode ([place]) 07/29/2017  . Low grade squamous intraepithelial lesion (LGSIL) on Papanicolaou smear of cervix 07/29/2017  . ADD  (attention deficit disorder) 12/18/2015  . Asthma 12/18/2015  . Generalized anxiety disorder 12/18/2015  . IBS (irritable bowel syndrome) 12/18/2015    Past Surgical History:  Procedure Laterality Date  . CESAREAN SECTION    . CHOLECYSTECTOMY    . ERCP    . ERCP    . OVARIAN CYST REMOVAL    . TUBAL LIGATION       OB History   No obstetric history on file.      Home Medications    Prior to Admission medications   Medication Sig Start Date End Date Taking? Authorizing Provider  albuterol (PROVENTIL HFA;VENTOLIN HFA) 108 (90 Base) MCG/ACT inhaler Inhale 2 puffs into the lungs every 6 (six) hours as needed for wheezing or shortness of breath.    [provider]  escitalopram (LEXAPRO) 10 MG tablet Take 1 tablet by mouth daily. 01/12/19 02/11/19  [provider]  methocarbamol (ROBAXIN) 750 MG tablet Take 1 tablet (750 mg total) by mouth 3 (three) times daily as needed (muscle spasm/pain). 01/11/19   Lajean Saver, MD  Multiple Vitamin (MULITIVITAMIN WITH MINERALS) TABS Take 1 tablet by mouth daily.    [provider]  pseudoephedrine (SUDAFED 12 HOUR) 120 MG 12 hr tablet Take 1 tablet (120 mg total) by mouth every 12 (twelve) hours. 12/31/16   Dorie Rank, MD    Family History No family history on file.  Social History Social History   Tobacco Use  . Smoking status: Never Smoker  . Smokeless tobacco: Never Used  Substance Use Topics  . Alcohol use: Yes    Comment: occasional  . Drug use: No     Allergies   Iodine-131, Nitrofurantoin macrocrystal, and Ivp dye [iodinated diagnostic agents]   Review of Systems Review of Systems  Constitutional: Negative for chills and fever.  Respiratory: Negative for shortness of breath.   Cardiovascular: Negative for chest pain.  Gastrointestinal: Positive for abdominal pain, constipation and nausea. Negative for anal bleeding, blood in stool and vomiting.  Genitourinary: Negative for dysuria, vaginal  bleeding and vaginal discharge.  All other systems reviewed and are negative.    Physical Exam Updated Vital Signs BP 128/86 (BP Location: Right Arm)   Pulse 76   Temp 98.3 F (36.8 C) (Oral)   Resp 16   Ht 5\' 7"  (1.702 m)   Wt 77.1 kg   SpO2 100%   BMI 26.63 kg/m   Physical Exam Vitals signs and nursing note reviewed.  Constitutional:      General: She is not in acute distress.    Appearance: She is well-developed. She is not toxic-appearing.  HENT:     Head: Normocephalic and atraumatic.  Eyes:     General:        Right eye: No discharge.        Left eye: No discharge.     Conjunctiva/sclera: Conjunctivae normal.  Neck:     Musculoskeletal: Neck supple.  Cardiovascular:     Rate and Rhythm: Normal rate and regular rhythm.  Pulmonary:     Effort: Pulmonary effort is normal. No respiratory distress.     Breath sounds: Normal breath sounds. No wheezing, rhonchi or rales.  Abdominal:     General: Bowel sounds are normal. There is no distension.     Palpations: Abdomen is soft.     Tenderness: There is generalized abdominal tenderness. There is no guarding or rebound.  Skin:    General: Skin is warm and dry.     Findings: No rash.  Neurological:     Mental Status: She is alert.     Comments: Clear speech.   Psychiatric:        Behavior: Behavior normal.    ED Treatments / Results  Labs (all labs ordered are listed, but only abnormal results are displayed) Labs Reviewed  URINALYSIS, ROUTINE W REFLEX MICROSCOPIC - Abnormal; Notable for the following components:      Result Value   Specific Gravity, Urine >1.030 (*)    All other components within normal limits  COMPREHENSIVE METABOLIC PANEL  CBC WITH DIFFERENTIAL/PLATELET  LIPASE, BLOOD  PREGNANCY, URINE    EKG None  Radiology Ct Abdomen Pelvis Wo Contrast  Result Date: 08/28/2019 CLINICAL DATA:  Abdominal pain. EXAM: CT ABDOMEN AND PELVIS WITHOUT CONTRAST TECHNIQUE: Multidetector CT imaging of the  abdomen and pelvis was performed following the standard protocol without IV contrast. COMPARISON:  06/09/2015 FINDINGS: Lower chest: No acute abnormality. Hepatobiliary: No focal liver abnormality is seen. Status post cholecystectomy. No biliary dilatation. Pancreas: Unremarkable. No pancreatic ductal dilatation or surrounding inflammatory changes. Spleen: Normal in size without focal abnormality. Adrenals/Urinary Tract: Normal appearance of the right kidney. Tiny stone within upper pole of the left kidney measures 3 mm. No hydronephrosis identified bilaterally. No ureteral calculi or bladder calculi. Stomach/Bowel: Stomach is within normal limits. Appendix appears normal. No evidence of bowel wall thickening, distention, or inflammatory changes. Scattered colonic diverticula noted without acute inflammation. Vascular/Lymphatic: Normal appearance of the abdominal aorta. No abdominopelvic adenopathy identified. Reproductive: Normal appearance of  the uterus. Low to intermediate attenuating cystic structure in the right adnexa measures 3.7 cm and 21 HU. Favor hemorrhagic cyst. New from previous examination. Other: No free fluid or fluid collections identified. Musculoskeletal: No acute or significant osseous findings. IMPRESSION: 1. No acute findings within the abdomen or pelvis. 2. Nonobstructing left renal calculus. 3. Intermediate to low-attenuation cystic structure within the right adnexa is noted. Favor hemorrhagic cyst versus endometrioma. Short-interval follow up ultrasound in 6-12 weeks is recommended, preferably during the week following the patient's normal menses. Electronically Signed   By: Signa Kellaylor  Stroud M.D.   On: 08/28/2019 18:09    Procedures Procedures (including critical care time)  Medications Ordered in ED Medications  morphine 4 MG/ML injection 4 mg (4 mg Intravenous Not Given 08/28/19 1718)  ondansetron (ZOFRAN) injection 4 mg (4 mg Intravenous Given 08/28/19 1715)  sodium chloride 0.9 %  bolus 1,000 mL (0 mLs Intravenous Stopped 08/28/19 1843)  dicyclomine (BENTYL) capsule 20 mg (20 mg Oral Given 08/28/19 1839)  alum & mag hydroxide-simeth (MAALOX/MYLANTA) 200-200-20 MG/5ML suspension 30 mL (30 mLs Oral Given 08/28/19 1839)    And  lidocaine (XYLOCAINE) 2 % viscous mouth solution 15 mL (15 mLs Oral Given 08/28/19 1840)     Initial Impression / Assessment and Plan / ED Course  I have reviewed the triage vital signs and the nursing notes.  Pertinent labs & imaging results that were available during my care of the patient were reviewed by me and considered in my medical decision making (see chart for details).    Patient presents to the ED with complaints of abdominal pain. Patient nontoxic appearing, in no apparent distress, vitals WNL. On exam patient tender to generalized abdomen, no peritoneal signs. Will evaluate with labs and CT A/P wo contrast. Analgesics, anti-emetics, and fluids administered.   ER work-up reviewed:  CBC: No anemia or leukocytosis.  CMP: No significant electrolyte derangement.  LFTs and renal function are within normal limits Lipase: WNL UA: No UTI Preg test: Negative Imaging  1. No acute findings within the abdomen or pelvis. 2. Nonobstructing left renal calculus. 3. Intermediate to low-attenuation cystic structure within the right adnexa is noted. Favor hemorrhagic cyst versus endometrioma. Short-interval follow up ultrasound in 6-12 weeks is recommended, preferably during the week following the patient's normal menses.   Reassessed the patient, she is not particularly having any pelvic pain, I offered pelvic exam to which she declined.  She states her pain is improved since initial arrival.  She states that she thinks it may have been gas related.  Repeat abdominal exam remains without peritoneal signs, do not suspect pancreatitis, diverticulitis, Perlov, obstruction, appendicitis, PID, or ectopic pregnancy.. Patient tolerating PO in the emergency department.  Will discharge home with supportive measures.  Will provide GI follow-up.  I discussed results, treatment plan, need for follow-up, and return precautions with the patient. Provided opportunity for questions, patient confirmed understanding and is in agreement with plan.   Findings and plan of care discussed with supervising physician Dr. Adela LankFloyd who is in agreement.   Final Clinical Impressions(s) / ED Diagnoses   Final diagnoses:  Generalized abdominal pain    ED Discharge Orders         Ordered    dicyclomine (BENTYL) 20 MG tablet  Every 8 hours PRN     08/28/19 1941    omeprazole (PRILOSEC) 20 MG capsule  Daily     08/28/19 1941    sucralfate (CARAFATE) 1 GM/10ML suspension  3 times daily  with meals & bedtime     08/28/19 1941    ondansetron (ZOFRAN ODT) 4 MG disintegrating tablet  Every 8 hours PRN     08/28/19 1941           Cherly Anderson, PA-C 08/28/19 1943    Melene Plan, DO 08/28/19 1947

## 2019-08-28 NOTE — ED Notes (Signed)
Patient transported to CT 

## 2019-08-28 NOTE — ED Triage Notes (Signed)
Pt c/o right side abd pain/flank pain that started 5 days ago-now generalized abd pain-pt states she thought she was constipated-did a cleanse and mag citrate with BM results but no change in pain-NAD-steady gait

## 2019-08-28 NOTE — Discharge Instructions (Addendum)
You were seen in the emergency department today for abdominal pain.  Your work-up was overall reassuring.  Your labs did not show substantial abnormalities.  Your CT scan did show findings of a ovarian cyst versus an endometrioma, radiology has recommended you have a follow-up ultrasound within 6 to 12 weeks preferably during the week following your menstrual cycle to further assess this.-Please follow-up with your OB/GYN doctor to schedule this.  We are sending you home with the following medicines for pain: -Zofran: Use every 8 hours as needed for nausea and vomiting -Omeprazole: Take each morning daily to help with acid buildup -Carafate: Take this medicine before meals and before bedtime to help with acid buildup -Bentyl: Take this medication every 8 hours as needed for abdominal cramping.  We have prescribed you new medication(s) today. Discuss the medications prescribed today with your pharmacist as they can have adverse effects and interactions with your other medicines including over the counter and prescribed medications. Seek medical evaluation if you start to experience new or abnormal symptoms after taking one of these medicines, seek care immediately if you start to experience difficulty breathing, feeling of your throat closing, facial swelling, or rash as these could be indications of a more serious allergic reaction  Follow-up with your primary care provider within 3 days, we have also given you information for a GI specialist to see within the week.  Return to the ER for new or worsening symptoms or any other concerns.

## 2020-01-15 ENCOUNTER — Telehealth: Payer: Self-pay | Admitting: Cardiology

## 2020-01-15 NOTE — Telephone Encounter (Signed)
I spoke with patient. She reports chest and back pain for last 2 weeks. Was initially in left side of chest but now is across chest and in back. Not related to movement. Not worse with exertion. Happens at random times.   History of asthma and used albuterol inhaler twice yesterday. She states this helped a little. She reports it is "difficult to say" if she is having shortness of breath. Has difficulty taking a deep breath at times. Some shortness of breath when doing activities like going up stairs. Occasional palpitations when lying on left side. Feels fatigued.  Negative covid test recently.  Will forward to Dr Tomie China for review

## 2020-01-15 NOTE — Telephone Encounter (Signed)
Patient states she has been experiencing chest pain and discomfort. She states pain started on the left of chest and over the past few weeks pain has spread to right side of chest.   Unsure if pain is continuous or coming and going. Also, unsure if patient has taken Nitroglycerin because call disconnected before patient could answer questions. Please call.    Pt c/o of Chest Pain: STAT if CP now or developed within 24 hours  1. Are you having CP right now? no  2. Are you experiencing any other symptoms (ex. SOB, nausea, vomiting, sweating)? no  3. How long have you been experiencing CP? Past few weeks  4. Is your CP continuous or coming and going?   5. Have you taken Nitroglycerin?  ?    ?

## 2020-01-16 NOTE — Telephone Encounter (Signed)
Patient unable to take of work. She has a half day tomorrow but [facility] is out of office afternoon, pt worked in th see Dr. Kirtland Bouchard in [facility] office.

## 2020-01-16 NOTE — Telephone Encounter (Signed)
You can put her in for me to be seeing her today maybe at around 2:00 or so... if things get worse she can go to the emergency room.

## 2020-01-17 ENCOUNTER — Encounter: Payer: Self-pay | Admitting: Cardiology

## 2020-01-17 ENCOUNTER — Ambulatory Visit (INDEPENDENT_AMBULATORY_CARE_PROVIDER_SITE_OTHER): Payer: Self-pay | Admitting: Cardiology

## 2020-01-17 ENCOUNTER — Other Ambulatory Visit: Payer: Self-pay

## 2020-01-17 VITALS — BP 130/86 | HR 83 | Ht 68.0 in | Wt 181.0 lb

## 2020-01-17 DIAGNOSIS — R0789 Other chest pain: Secondary | ICD-10-CM

## 2020-01-17 DIAGNOSIS — R0600 Dyspnea, unspecified: Secondary | ICD-10-CM

## 2020-01-17 DIAGNOSIS — F411 Generalized anxiety disorder: Secondary | ICD-10-CM

## 2020-01-17 DIAGNOSIS — R0609 Other forms of dyspnea: Secondary | ICD-10-CM | POA: Insufficient documentation

## 2020-01-17 NOTE — Progress Notes (Signed)
Cardiology Office Note:    Date:  01/17/2020   ID:  Berdell Nevitt, DOB [birth year], MRN [id number]  PCP:  [facility]  Cardiologist:  Jenne Campus, MD    Referring MD: [facility]   Chief Complaint  Patient presents with  . Shortness of Breath  . Chest Pain    Tightness    History of Present Illness:    Latoya Schmidt is a 49 y.o. female with past medical history significant for anxiety disorder, IBS, atypical chest pain.  Last time seen last year with quite extensive evaluation has been done for coronary artery disease.  That include stress test.  She did quite well on the treadmill test was negative.  She also got a calcium score done which showed calcium score of 0.  She comes today to my office because for last month she said she is being under a lot of stress.  She feels a lot of pain in her neck, there is also tenderness around the costochondral junction, there is also some place in the left upper chest that she have tenderness in that apparently being investigated before which included mammogram.  Her EKG done today did not show any acute changes.  At the same time she is able to walk climb stairs no difficulty there is no exertional symptoms.  She described however this sensation is quite severe she said 8 scale up to 10.  When she get this sensation lasts all day.  She said usually when she wakes up she is good and then when times goes by this sensation gets much worse. She also brought another issue to me she thinks her left eye is a little bit sagging and honestly when I look at her I cannot tell.  Past Medical History:  Diagnosis Date  . Anxiety   . Asthma   . IBS (irritable bowel syndrome)   . Ovarian cyst     Past Surgical History:  Procedure Laterality Date  . CESAREAN SECTION    . CHOLECYSTECTOMY    . ERCP    . ERCP    . OVARIAN CYST REMOVAL    . TUBAL LIGATION      Current Medications: Current Meds  Medication  Sig  . albuterol (PROVENTIL HFA;VENTOLIN HFA) 108 (90 Base) MCG/ACT inhaler Inhale 2 puffs into the lungs every 6 (six) hours as needed for wheezing or shortness of breath.  . Multiple Vitamin (MULITIVITAMIN WITH MINERALS) TABS Take 1 tablet by mouth daily.  . Omega-3 Fatty Acids (FISH OIL PO) Take by mouth.  . Triprolidine-Pseudoephedrine (ANTIHISTAMINE DECONGESTANT PO) Take by mouth.     Allergies:   Iodine-131, Nitrofurantoin macrocrystal, and Ivp dye [iodinated diagnostic agents]   Social History   Socioeconomic History  . Marital status: Divorced    Spouse name: Not on file  . Number of children: Not on file  . Years of education: Not on file  . Highest education level: Not on file  Occupational History  . Not on file  Tobacco Use  . Smoking status: Never Smoker  . Smokeless tobacco: Never Used  Substance and Sexual Activity  . Alcohol use: Yes    Comment: occasional  . Drug use: No  . Sexual activity: Not on file  Other Topics Concern  . Not on file  Social History Narrative  . Not on file   Social Determinants of Health   Financial Resource Strain:   . Difficulty of Paying Living Expenses: Not on file  Food Insecurity:   . Worried About Programme researcher, broadcasting/film/video in the Last Year: Not on file  . Ran Out of Food in the Last Year: Not on file  Transportation Needs:   . Lack of Transportation (Medical): Not on file  . Lack of Transportation (Non-Medical): Not on file  Physical Activity:   . Days of Exercise per Week: Not on file  . Minutes of Exercise per Session: Not on file  Stress:   . Feeling of Stress : Not on file  Social Connections:   . Frequency of Communication with Friends and Family: Not on file  . Frequency of Social Gatherings with Friends and Family: Not on file  . Attends Religious Services: Not on file  . Active Member of Clubs or Organizations: Not on file  . Attends Banker Meetings: Not on file  . Marital Status: Not on file      Family History: The patient's family history is not on file. ROS:   Please see the history of present illness.    All 14 point review of systems negative except as described per history of present illness  EKGs/Labs/Other Studies Reviewed:      Recent Labs: 08/28/2019: ALT 25; BUN 20; Creatinine, Ser 0.82; Hemoglobin 13.8; Platelets 265; Potassium 4.0; Sodium 136  Recent Lipid Panel No results found for: CHOL, TRIG, HDL, CHOLHDL, VLDL, LDLCALC, LDLDIRECT  Physical Exam:    VS:  BP 130/86 (BP Location: Left Arm, Patient Position: Sitting, Cuff Size: Normal)   Pulse 83   Ht 5\' 8"  (1.727 m)   Wt 181 lb (82.1 kg)   SpO2 97%   BMI 27.52 kg/m     Wt Readings from Last 3 Encounters:  01/17/20 181 lb (82.1 kg)  08/28/19 170 lb (77.1 kg)  05/17/19 173 lb (78.5 kg)     GEN:  Well nourished, well developed in no acute distress HEENT: Normal NECK: No JVD; No carotid bruits LYMPHATICS: No lymphadenopathy CARDIAC: RRR, no murmurs, no rubs, no gallops RESPIRATORY:  Clear to auscultation without rales, wheezing or rhonchi  ABDOMEN: Soft, non-tender, non-distended MUSCULOSKELETAL:  No edema; No deformity  SKIN: Warm and dry LOWER EXTREMITIES: no swelling NEUROLOGIC:  Alert and oriented x 3 PSYCHIATRIC:  Normal affect   ASSESSMENT:    1. Chest discomfort   2. Dyspnea on exertion   3. Generalized anxiety disorder    PLAN:    In order of problems listed above:  1. Chest discomfort very atypical, prior work-up done just not even a year ago was negative with calcium score 0.  The characteristic of the pain that she described very atypical I suspect may be costochondritis is the etiology of this symptoms.  She also told me when she take Tylenol it helps with the pain.  Therefore, I advised her to start taking Motrin to her milligrams 3 times a day for the next week.  Also asked her to go to pharmacy and try to get lidocaine patches simple to her chest.  We also spent a great deal of  time talking about her psychological stress and how to deal with this.  I suspect her symptoms are related to her anxiety and stress.  She was very open on this conversation and actually was quite happy that I was able to talk to her about that.  I tried to encourage her to start exercising on the regular basis just simply start walking we did also talk about relaxation technique including yoga and  she is already thinking about starting it.  I strongly encouraged her to do that.  She used to be taking Lexapro for anxiety however she stopped herself because she was simply felt awful.  In the future she may benefit from seeing psychotherapist or psychiatrist as well.  Psychotherapy probably will be very beneficial for her.  I would like to see her back in my office in about a month to see how she does. 2. Dyspnea on exertion again with recent work-up was negative. 3. General anxiety disorder which I suspect is responsible for her symptoms.  I did not see any worrisome signs and symptoms of organic heart problem.   Medication Adjustments/Labs and Tests Ordered: Current medicines are reviewed at length with the patient today.  Concerns regarding medicines are outlined above.  Orders Placed This Encounter  Procedures  . EKG 12-Lead   Medication changes: No orders of the defined types were placed in this encounter.   Signed, Georgeanna Lea, MD, [facility] 01/17/2020 3:41 PM    [facility]

## 2020-01-17 NOTE — Patient Instructions (Signed)
Medication Instructions:  none *If you need a refill on your cardiac medications before your next appointment, please call your pharmacy*  Lab Work: none If you have labs (blood work) drawn today and your tests are completely normal, you will receive your results only by: Marland Kitchen MyChart Message (if you have MyChart) OR . A paper copy in the mail If you have any lab test that is abnormal or we need to change your treatment, we will call you to review the results.  Testing/Procedures: none  Follow-Up: At [facility], you and your health needs are our priority.  As part of our continuing mission to provide you with exceptional heart care, we have created designated Provider Care Teams.  These Care Teams include your primary Cardiologist (physician) and Advanced Practice Providers (APPs -  Physician Assistants and Nurse Practitioners) who all work together to provide you with the care you need, when you need it.  Your next appointment:   1 month(s)  The format for your next appointment:   Either In Person or Virtual  Provider:   Gypsy Balsam, MD  Other Instructions   Stay Well

## 2020-01-19 IMAGING — DX DG CHEST 2V
2 series · 2 of 2 positions shown · non-contrast
Comparison: 05/11/2018

CLINICAL DATA: Productive cough and shortness of breath for 2
weeks.

EXAM:
CHEST - 2 VIEW

[chest pa]
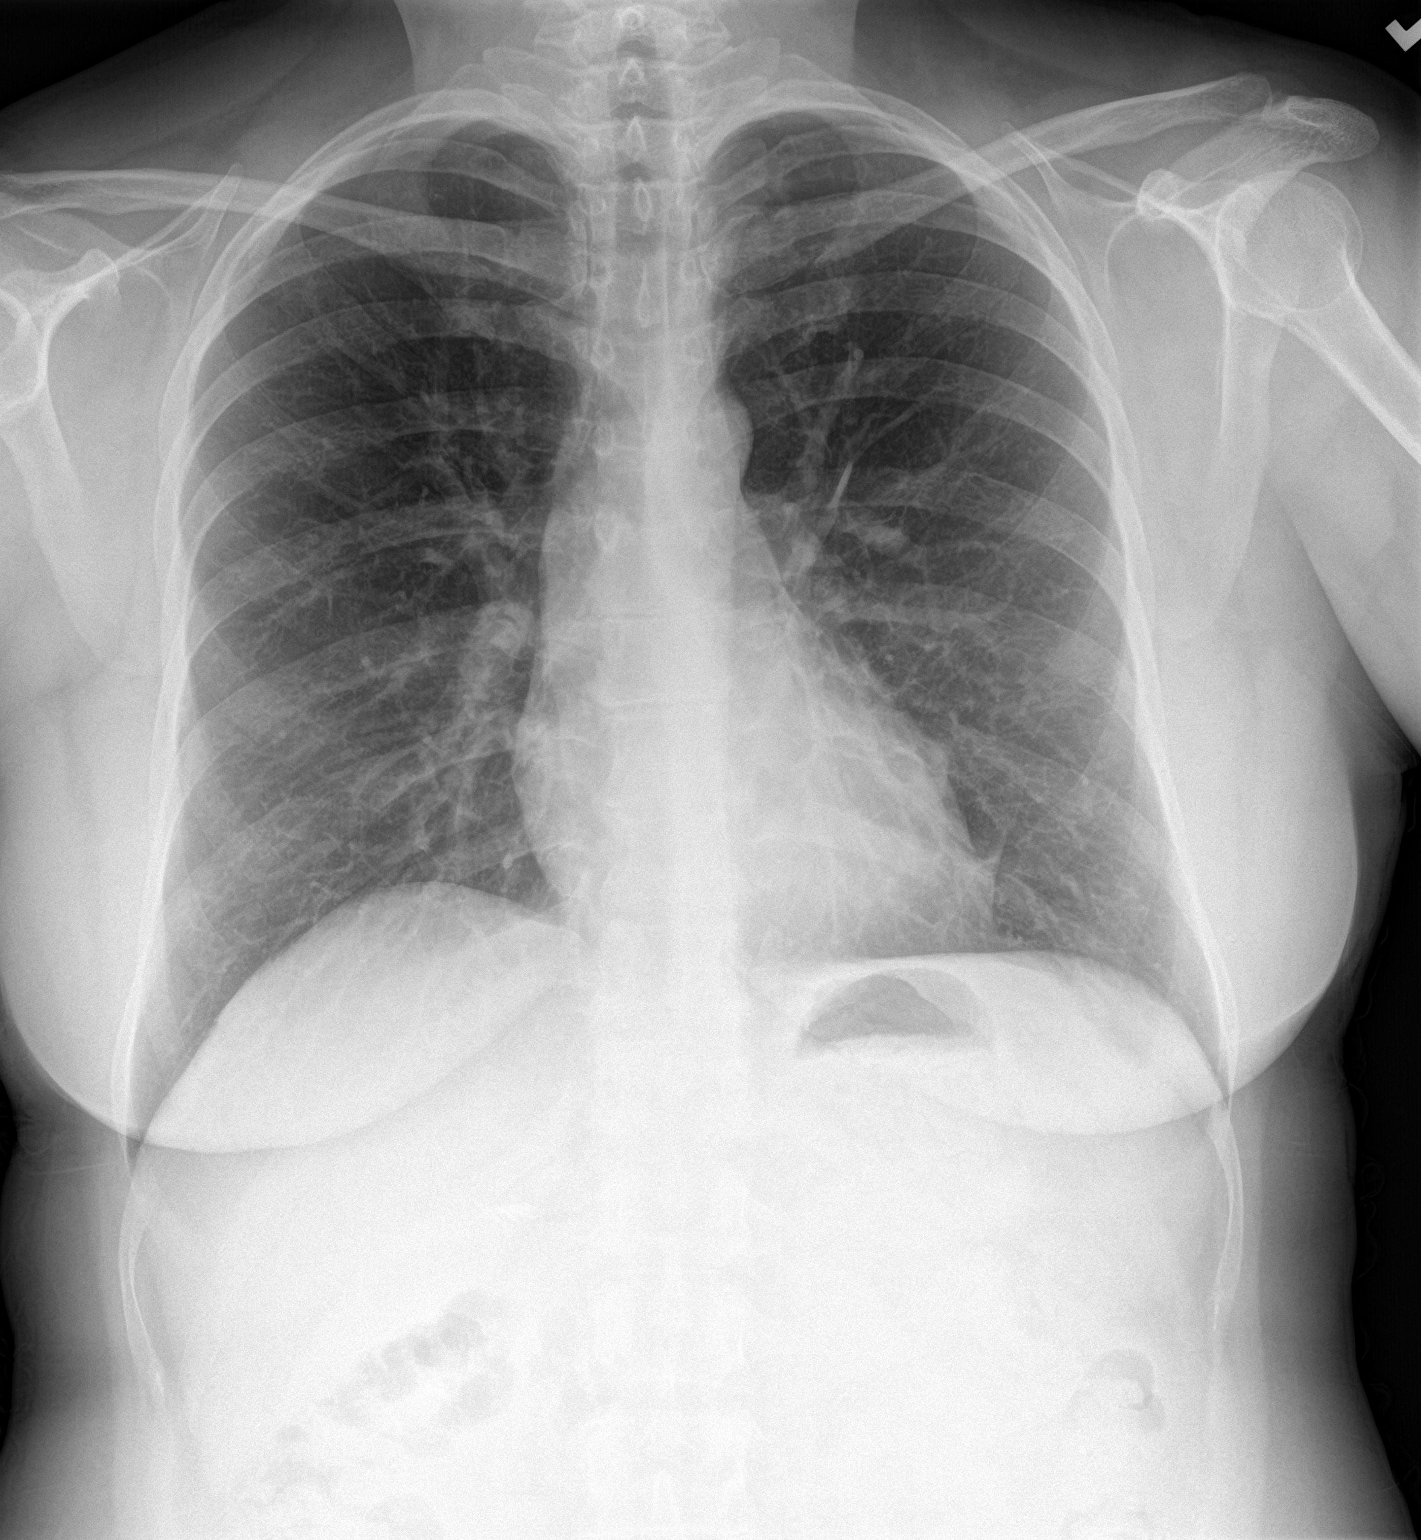

[chest lat]
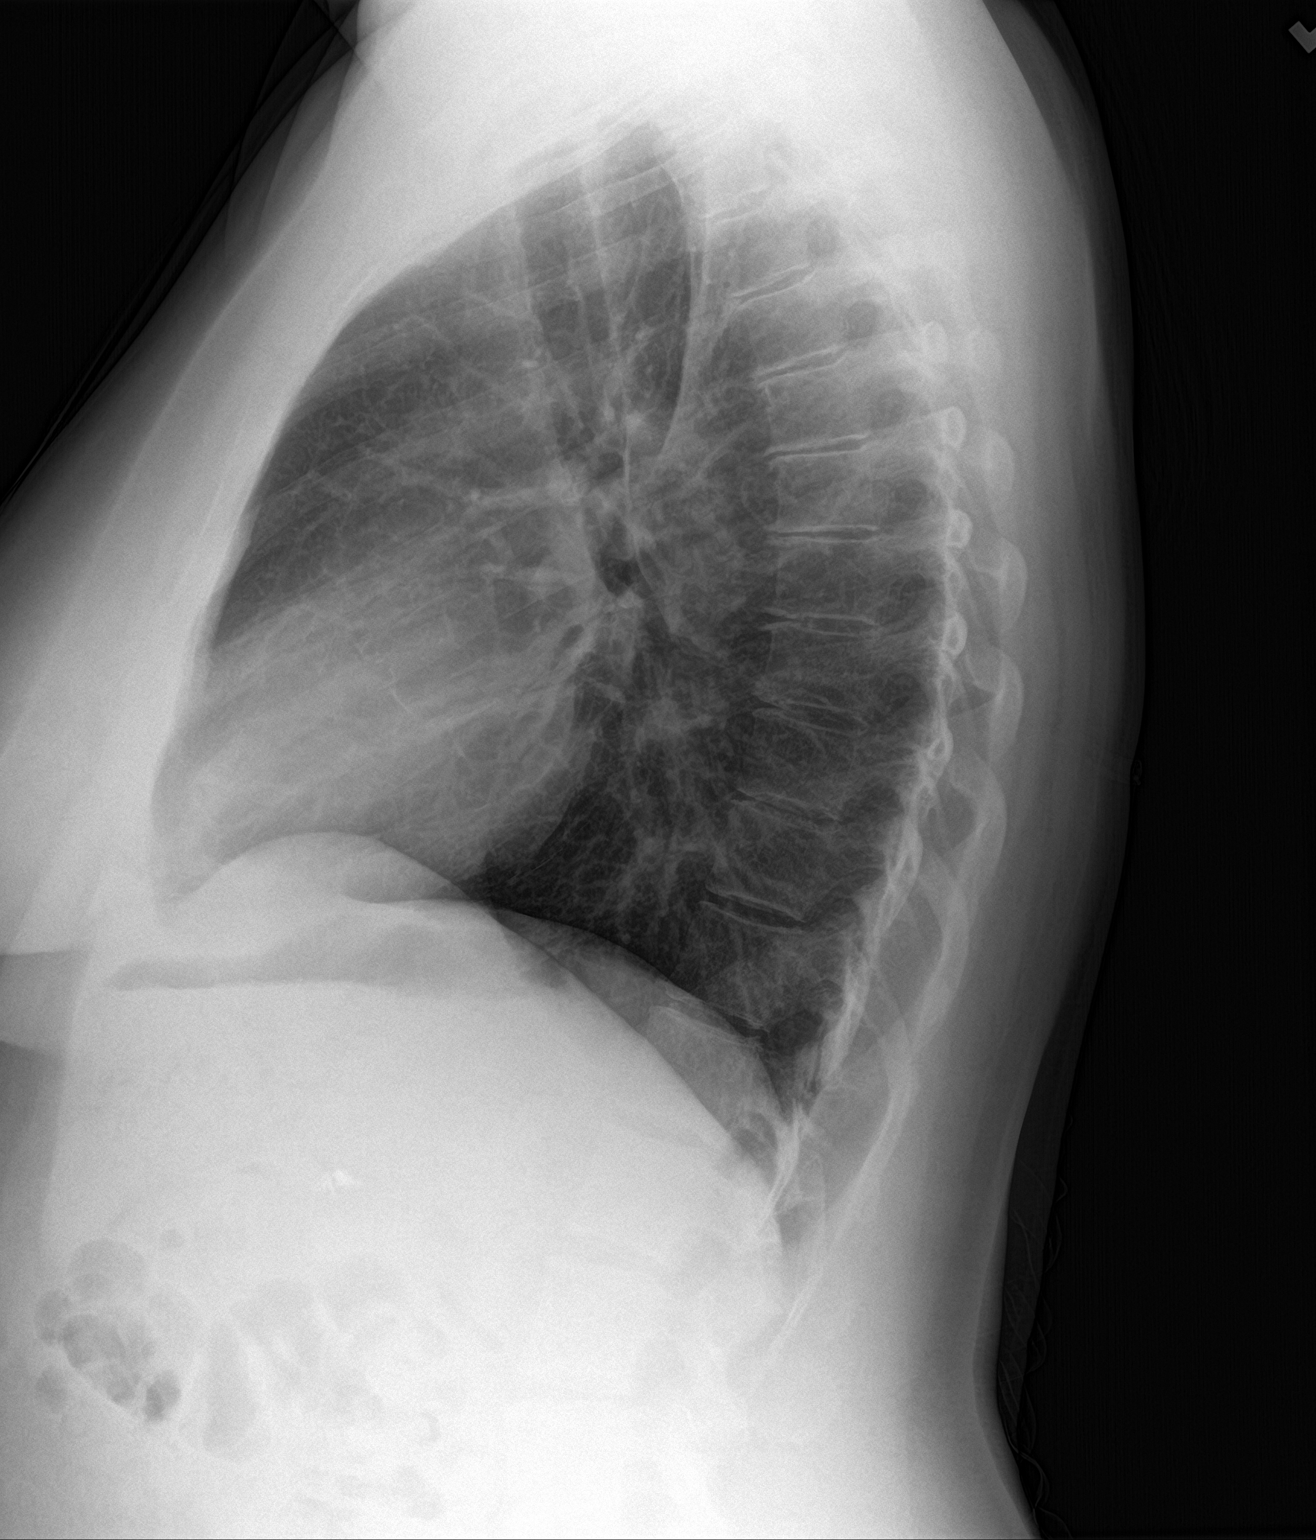

[2 of 2 positions shown; findings below may reference images not displayed]

FINDINGS: The heart size and mediastinal contours are within normal limits.
Both lungs are clear. The visualized skeletal structures are
unremarkable.
IMPRESSION: No active cardiopulmonary disease.

## 2020-02-14 ENCOUNTER — Other Ambulatory Visit: Payer: Self-pay

## 2020-02-14 ENCOUNTER — Telehealth: Payer: Self-pay | Admitting: Cardiology

## 2020-05-30 ENCOUNTER — Telehealth: Payer: Self-pay | Admitting: Cardiology

## 2020-05-30 NOTE — Telephone Encounter (Signed)
Patient c/o Palpitations:  High priority if patient c/o lightheadedness, shortness of breath, or chest pain  1) How long have you had palpitations/irregular HR/ Afib? Are you having the symptoms now? Palpitations - happens out of the blue. Yes having symptoms now.   2) Are you currently experiencing lightheadedness, SOB or CP? no  3) Do you have a history of afib (atrial fibrillation) or irregular heart rhythm? No   4) Have you checked your BP or HR? (document readings if available): last week her BP was 158/90 then went down to 133/90. Has not checked it today          5)Are you experiencing any other symptoms? Painful headache on right side of hear head above her ear. States she can feel it in her eye.   Patient scheduled for 06/20/20 with Dr. Tomie China.

## 2020-05-30 NOTE — Telephone Encounter (Signed)
Spoke to the patient just now and let her know that Dr. Servando Salina recommends that she proceed to the ED since she is still having these symptoms. She verbalizes understanding and states that she will do this. No other issues or concerns were noted at this time.    Encouraged patient to call back with any questions or concerns.

## 2020-05-30 NOTE — Telephone Encounter (Signed)
Please let the patient know that if this occurs, she should go to the ED.

## 2020-05-30 NOTE — Telephone Encounter (Signed)
Spoke to the patient just now and she let me know that she has been having some palpitations at random times over the past 1-2 weeks. She states however that her main concern is that she has a headache that will not go away. She states that she has had the headache constantly for the last 1-2 weeks. She also states that she noticed today that her right eye lid seems to be a little swollen and she feels pressure behind her eyes. She has not been able to take her vital signs recently and she is not in a place where she can take them for me at this time. Patient states that she is unsure if she should be seen by her PCP for this issue or if it is cardiac related.   I will route this to Dr. Servando Salina for further advise or recommendations.

## 2020-06-04 ENCOUNTER — Encounter (HOSPITAL_BASED_OUTPATIENT_CLINIC_OR_DEPARTMENT_OTHER): Payer: Self-pay | Admitting: *Deleted

## 2020-06-04 ENCOUNTER — Emergency Department (HOSPITAL_BASED_OUTPATIENT_CLINIC_OR_DEPARTMENT_OTHER)
Admission: EM | Admit: 2020-06-04 | Discharge: 2020-06-04 | Disposition: A | Discharge status: Home / Self Care | Payer: BLUE CROSS/BLUE SHIELD | Attending: Emergency Medicine | Admitting: Emergency Medicine

## 2020-06-04 ENCOUNTER — Emergency Department (HOSPITAL_BASED_OUTPATIENT_CLINIC_OR_DEPARTMENT_OTHER): Payer: BLUE CROSS/BLUE SHIELD

## 2020-06-04 ENCOUNTER — Other Ambulatory Visit: Payer: Self-pay

## 2020-06-04 DIAGNOSIS — Z91041 Radiographic dye allergy status: Secondary | ICD-10-CM | POA: Diagnosis not present

## 2020-06-04 DIAGNOSIS — R109 Unspecified abdominal pain: Secondary | ICD-10-CM | POA: Diagnosis present

## 2020-06-04 DIAGNOSIS — Z888 Allergy status to other drugs, medicaments and biological substances status: Secondary | ICD-10-CM | POA: Insufficient documentation

## 2020-06-04 DIAGNOSIS — J45909 Unspecified asthma, uncomplicated: Secondary | ICD-10-CM | POA: Insufficient documentation

## 2020-06-04 DIAGNOSIS — N12 Tubulo-interstitial nephritis, not specified as acute or chronic: Secondary | ICD-10-CM | POA: Insufficient documentation

## 2020-06-04 MED ORDER — CEPHALEXIN 500 MG PO CAPS: 500.0000 mg | ORAL_CAPSULE | Freq: Two times a day (BID) | ORAL | 0 refills | Status: DC

## 2020-06-04 MED ORDER — CEPHALEXIN 500 MG PO CAPS: 500.0000 mg | ORAL_CAPSULE | Freq: Two times a day (BID) | ORAL | 0 refills | Status: AC

## 2020-06-04 NOTE — ED Provider Notes (Signed)
[facility] EMERGENCY DEPARTMENT Provider Note   CSN: [id number] Arrival date & time: 06/04/20  1754     History Chief Complaint  Patient presents with  . Flank Pain    Latoya Schmidt is a 49 y.o. female.  HPI     2 week of bilateral flank pain Sharp sometimes achy, both sides Nothing makes it better or worse 5-6/10 constant, sometimes becomes more severe Some lower abdominal pain feels like radiating from back Verge of nausea, no vomiting No fevers or chills No diarrhea or constipation, no concerning VB/discharge   Amoxicillin for asthmatic bronchitis Thought maybe kidney stone Saw dr yesterday had labs and urine.    Past Medical History:  Diagnosis Date  . Anxiety   . Asthma   . IBS (irritable bowel syndrome)   . Ovarian cyst     Patient Active Problem List   Diagnosis Date Noted  . Dyspnea on exertion 01/17/2020  . Atypical chest pain 01/30/2019  . Mixed dyslipidemia 01/30/2019  . Elevated BP without diagnosis of hypertension 01/12/2019  . Breast mass, right 09/11/2018  . Breast pain, left 08/30/2018  . Pap smear of cervix shows high risk HPV present 08/22/2017  . Current moderate episode of major depressive disorder without prior episode (HCC) 07/29/2017  . Low grade squamous intraepithelial lesion (LGSIL) on Papanicolaou smear of cervix 07/29/2017  . ADD (attention deficit disorder) 12/18/2015  . Asthma 12/18/2015  . Generalized anxiety disorder 12/18/2015  . IBS (irritable bowel syndrome) 12/18/2015  . Mild intermittent asthma without complication 12/18/2015  . Mixed hyperlipidemia 12/18/2015    Past Surgical History:  Procedure Laterality Date  . CESAREAN SECTION    . CHOLECYSTECTOMY    . ERCP    . ERCP    . OVARIAN CYST REMOVAL    . TUBAL LIGATION       OB History   No obstetric history on file.     History reviewed. No pertinent family history.  Social History   Tobacco Use  . Smoking status: Never Smoker  .  Smokeless tobacco: Never Used  Vaping Use  . Vaping Use: Never used  Substance Use Topics  . Alcohol use: Yes    Comment: occasional  . Drug use: No    Home Medications Prior to Admission medications   Medication Sig Start Date End Date Taking? Authorizing Provider  albuterol (PROVENTIL HFA;VENTOLIN HFA) 108 (90 Base) MCG/ACT inhaler Inhale 2 puffs into the lungs every 6 (six) hours as needed for wheezing or shortness of breath.    [provider]  cephALEXin (KEFLEX) 500 MG capsule Take 1 capsule (500 mg total) by mouth 2 (two) times daily for 10 days. 06/04/20 06/14/20  Alvira Monday, MD  Multiple Vitamin (MULITIVITAMIN WITH MINERALS) TABS Take 1 tablet by mouth daily.    [provider]  Omega-3 Fatty Acids (FISH OIL PO) Take by mouth.    [provider]  Triprolidine-Pseudoephedrine (ANTIHISTAMINE DECONGESTANT PO) Take by mouth.    [provider]    Allergies    Iodine-131, Nitrofurantoin macrocrystal, and Ivp dye [iodinated diagnostic agents]  Review of Systems   Review of Systems  Constitutional: Negative for fever.  HENT: Negative for sore throat.   Eyes: Negative for visual disturbance.  Respiratory: Negative for cough and shortness of breath.   Cardiovascular: Negative for chest pain.  Gastrointestinal: Positive for abdominal pain. Negative for constipation, diarrhea, nausea and vomiting.  Genitourinary: Positive for flank pain and urgency (mild). Negative for difficulty urinating,  dysuria, vaginal bleeding and vaginal discharge.  Musculoskeletal: Negative for back pain and neck pain.  Skin: Negative for rash.  Neurological: Negative for syncope.    Physical Exam Updated Vital Signs BP 121/78 (BP Location: Right Arm)   Pulse 74   Temp 98.1 F (36.7 C) (Oral)   Resp 16   Ht 5\' 6"  (1.676 m)   Wt 84.8 kg   SpO2 100%   BMI 30.18 kg/m   Physical Exam Vitals and nursing note reviewed.  Constitutional:      General: She is not  in acute distress.    Appearance: She is well-developed. She is not diaphoretic.  HENT:     Head: Normocephalic and atraumatic.  Eyes:     Conjunctiva/sclera: Conjunctivae normal.  Cardiovascular:     Rate and Rhythm: Normal rate and regular rhythm.     Heart sounds: Normal heart sounds. No murmur. No friction rub. No gallop.   Pulmonary:     Effort: Pulmonary effort is normal. No respiratory distress.     Breath sounds: Normal breath sounds. No wheezing or rales.  Abdominal:     General: There is no distension.     Palpations: Abdomen is soft.     Tenderness: There is no abdominal tenderness. There is no guarding.  Musculoskeletal:        General: No tenderness.     Cervical back: Normal range of motion.  Skin:    General: Skin is warm and dry.     Findings: No erythema or rash.  Neurological:     Mental Status: She is alert and oriented to person, place, and time.     ED Results / Procedures / Treatments   Labs (all labs ordered are listed, but only abnormal results are displayed) Labs Reviewed  URINE CULTURE    EKG None  Radiology CT Renal Stone Study  Result Date: 06/04/2020 CLINICAL DATA:  Right flank pain x2 weeks. EXAM: CT ABDOMEN AND PELVIS WITHOUT CONTRAST TECHNIQUE: Multidetector CT imaging of the abdomen and pelvis was performed following the standard protocol without IV contrast. COMPARISON:  August 28, 2019 FINDINGS: Lower chest: No acute abnormality. Hepatobiliary: No focal liver abnormality is seen. Status post cholecystectomy. No biliary dilatation. Pancreas: Unremarkable. No pancreatic ductal dilatation or surrounding inflammatory changes. Spleen: Normal in size without focal abnormality. Adrenals/Urinary Tract: Adrenal glands are unremarkable. Kidneys are normal in size without focal lesions or hydronephrosis. A 2 mm nonobstructing renal stone is seen within the mid left kidney. Bladder is unremarkable. Stomach/Bowel: Stomach is within normal limits.  Appendix appears normal. No evidence of bowel wall thickening, distention, or inflammatory changes. Noninflamed diverticula are seen throughout the large bowel. Vascular/Lymphatic: No significant vascular findings are present. No enlarged abdominal or pelvic lymph nodes. Reproductive: Uterus and bilateral adnexa are unremarkable. Other: No abdominal wall hernia or abnormality. No abdominopelvic ascites. Musculoskeletal: No acute or significant osseous findings. IMPRESSION: 1. 2 mm nonobstructing renal stone within the mid left kidney. 2. Noninflamed diverticula throughout the large bowel. 3. Evidence of prior cholecystectomy. Electronically Signed   By: Virgina Norfolk M.D.   On: 06/04/2020 18:50    Procedures Procedures (including critical care time)  Medications Ordered in ED Medications - No data to display  ED Course  I have reviewed the triage vital signs and the nursing notes.  Pertinent labs & imaging results that were available during my care of the patient were reviewed by me and considered in my medical decision making (see chart for details).  MDM Rules/Calculators/A&P                       49 year old female with history of presents with concern for flank pain.  Reviewed the labs that she had completed yesterday which showed normal renal function, no leukocytosis, and concern for UTI and urinalysis.  CT completed given concern for for pain to evaluate for signs of nephrolithiasis or abscess, and shows no acute findings. Nonobstructive stone.  Given prescription for 500 mg of Keflex for suspected pyelonephritis. Patient discharged in stable condition with understanding of reasons to return.   Final Clinical Impression(s) / ED Diagnoses Final diagnoses:  Pyelonephritis  Flank pain    Rx / DC Orders ED Discharge Orders         Ordered    cephALEXin (KEFLEX) 500 MG capsule  2 times daily,   Status:  Discontinued     Reprint     06/04/20 1931    cephALEXin (KEFLEX) 500 MG  capsule  2 times daily     Discontinue  Reprint     06/04/20 2110           Alvira Monday, MD 06/06/20 0050

## 2020-06-04 NOTE — ED Triage Notes (Signed)
Pt c/o right flank x 2 weeks, seen at PMD office for same yesterday , urine and labs done

## 2020-06-04 NOTE — ED Notes (Signed)
Patient transported to CT 

## 2020-06-07 LAB — URINE CULTURE: Culture: 60000 — AB

## 2020-06-08 ENCOUNTER — Telehealth: Payer: Self-pay | Admitting: Emergency Medicine

## 2020-06-08 NOTE — Telephone Encounter (Signed)
Post ED Visit - Positive Culture Follow-up  Culture report reviewed by antimicrobial stewardship pharmacist: [facility] []  , Pharm.D. []  Enzo Bi, Pharm.D., BCPS AQ-ID []  , Pharm.D., BCPS []  Celedonio Miyamoto, .D., BCPS []  [place], .D., BCPS, AAHIVP []  Georgina Pillion, Pharm.D., BCPS, AAHIVP [x]  [address], PharmD, BCPS []  , PharmD, BCPS []  [place], PharmD, BCPS []  [address], PharmD []  , PharmD, BCPS []  Estella Husk, PharmD  Pharmacy Team []  Lysle Pearl, PharmD []  , PharmD []  Phillips Climes, PharmD []  , Rph []  Agapito Games) , PharmD []  Verlan Friends, PharmD []  , PharmD []  Mervyn Gay, PharmD []  , PharmD []  Vinnie Level, PharmD []  [facility], PharmD []  , PharmD []  Len Childs, PharmD   Positive urine culture Treated with Cephalexin, organism sensitive to the same and no further patient follow-up is required at this time.  Doreena Maulden 06/08/2020, 11:54 AM

## 2020-06-20 ENCOUNTER — Ambulatory Visit: Payer: Self-pay | Admitting: Cardiology

## 2020-10-06 ENCOUNTER — Emergency Department (HOSPITAL_BASED_OUTPATIENT_CLINIC_OR_DEPARTMENT_OTHER)
Admission: EM | Admit: 2020-10-06 | Discharge: 2020-10-06 | Disposition: A | Discharge status: Home / Self Care | Payer: BLUE CROSS/BLUE SHIELD | Attending: Emergency Medicine | Admitting: Emergency Medicine

## 2020-10-06 ENCOUNTER — Other Ambulatory Visit: Payer: Self-pay

## 2020-10-06 ENCOUNTER — Encounter (HOSPITAL_BASED_OUTPATIENT_CLINIC_OR_DEPARTMENT_OTHER): Payer: Self-pay

## 2020-10-06 ENCOUNTER — Emergency Department (HOSPITAL_BASED_OUTPATIENT_CLINIC_OR_DEPARTMENT_OTHER): Payer: BLUE CROSS/BLUE SHIELD

## 2020-10-06 DIAGNOSIS — F419 Anxiety disorder, unspecified: Secondary | ICD-10-CM | POA: Diagnosis not present

## 2020-10-06 DIAGNOSIS — J45909 Unspecified asthma, uncomplicated: Secondary | ICD-10-CM | POA: Diagnosis not present

## 2020-10-06 DIAGNOSIS — R079 Chest pain, unspecified: Secondary | ICD-10-CM | POA: Diagnosis present

## 2020-10-06 LAB — CBC
HCT: 42.8 % (ref 36.0–46.0)
Hemoglobin: 14 g/dL (ref 12.0–15.0)
MCH: 27.9 pg (ref 26.0–34.0)
MCHC: 32.7 g/dL (ref 30.0–36.0)
MCV: 85.3 fL (ref 80.0–100.0)
Platelets: 313 10*3/uL (ref 150–400)
RBC: 5.02 MIL/uL (ref 3.87–5.11)
RDW: 12 % (ref 11.5–15.5)
WBC: 7 10*3/uL (ref 4.0–10.5)
nRBC: 0 % (ref 0.0–0.2)

## 2020-10-06 LAB — BASIC METABOLIC PANEL
Anion gap: 10 (ref 5–15)
BUN: 16 mg/dL (ref 6–20)
CO2: 24 mmol/L (ref 22–32)
Calcium: 9.3 mg/dL (ref 8.9–10.3)
Chloride: 103 mmol/L (ref 98–111)
Creatinine, Ser: 1.06 mg/dL — ABNORMAL HIGH (ref 0.44–1.00)
GFR, Estimated: >60 mL/min (ref >60)
Glucose, Bld: 144 mg/dL — ABNORMAL HIGH (ref 70–99)
Potassium: 3.7 mmol/L (ref 3.5–5.1)
Sodium: 137 mmol/L (ref 135–145)

## 2020-10-06 LAB — TROPONIN I (HIGH SENSITIVITY): Troponin I (High Sensitivity): 4 ng/L (ref <18)

## 2020-10-06 MED ORDER — LORAZEPAM 1 MG PO TABS
1.0000 mg | ORAL_TABLET | Freq: Once | ORAL | Status: AC
  Administered 2020-10-06: 1 mg via ORAL
  Filled 2020-10-06: qty 1

## 2020-10-06 NOTE — ED Provider Notes (Signed)
[facility] EMERGENCY DEPARTMENT Provider Note   CSN: [id number] Arrival date & time: 10/06/20  1927     History Chief Complaint  Patient presents with  . Chest Pain    Latoya Schmidt is a 49 y.o. female.  The history is provided by the patient.  Chest Pain Pain location:  R chest Pain quality: aching   Pain radiates to:  Neck Pain severity:  Mild Onset quality:  Sudden Timing:  Constant Chronicity:  New Context: stress (under a lot of stress started and smoking weed.)   Relieved by:  Nothing Worsened by:  Nothing Associated symptoms: no abdominal pain, no back pain, no cough, no fever, no palpitations, no shortness of breath and no vomiting   Risk factors: high cholesterol   Risk factors: no coronary artery disease, no hypertension, not pregnant and no prior DVT/PE        Past Medical History:  Diagnosis Date  . Anxiety   . Asthma   . IBS (irritable bowel syndrome)   . Ovarian cyst     Patient Active Problem List   Diagnosis Date Noted  . Dyspnea on exertion 01/17/2020  . Atypical chest pain 01/30/2019  . Mixed dyslipidemia 01/30/2019  . Elevated BP without diagnosis of hypertension 01/12/2019  . Breast mass, right 09/11/2018  . Breast pain, left 08/30/2018  . Pap smear of cervix shows high risk HPV present 08/22/2017  . Current moderate episode of major depressive disorder without prior episode (HCC) 07/29/2017  . Low grade squamous intraepithelial lesion (LGSIL) on Papanicolaou smear of cervix 07/29/2017  . ADD (attention deficit disorder) 12/18/2015  . Asthma 12/18/2015  . Generalized anxiety disorder 12/18/2015  . IBS (irritable bowel syndrome) 12/18/2015  . Mild intermittent asthma without complication 12/18/2015  . Mixed hyperlipidemia 12/18/2015    Past Surgical History:  Procedure Laterality Date  . CESAREAN SECTION    . CHOLECYSTECTOMY    . ERCP    . ERCP    . OVARIAN CYST REMOVAL    . TUBAL LIGATION       OB History   No  obstetric history on file.     No family history on file.  Social History   Tobacco Use  . Smoking status: Never Smoker  . Smokeless tobacco: Never Used  Vaping Use  . Vaping Use: Never used  Substance Use Topics  . Alcohol use: Not Currently  . Drug use: Yes    Types: Marijuana    Home Medications Prior to Admission medications   Medication Sig Start Date End Date Taking? Authorizing Provider  albuterol (PROVENTIL HFA;VENTOLIN HFA) 108 (90 Base) MCG/ACT inhaler Inhale 2 puffs into the lungs every 6 (six) hours as needed for wheezing or shortness of breath.    [provider]  montelukast (SINGULAIR) 10 MG tablet Take 10 mg by mouth daily. 06/04/20   [provider]  Multiple Vitamin (MULITIVITAMIN WITH MINERALS) TABS Take 1 tablet by mouth daily.    [provider]  Omega-3 Fatty Acids (FISH OIL PO) Take by mouth.    [provider]  Triprolidine-Pseudoephedrine (ANTIHISTAMINE DECONGESTANT PO) Take by mouth.    [provider]    Allergies    Iodine-131, Nitrofurantoin macrocrystal, and Ivp dye [iodinated diagnostic agents]  Review of Systems   Review of Systems  Constitutional: Negative for chills and fever.  HENT: Negative for ear pain and sore throat.   Eyes: Negative for pain and visual disturbance.  Respiratory: Negative for cough and shortness  of breath.   Cardiovascular: Positive for chest pain. Negative for palpitations.  Gastrointestinal: Negative for abdominal pain and vomiting.  Genitourinary: Negative for dysuria and hematuria.  Musculoskeletal: Negative for arthralgias and back pain.  Skin: Negative for color change and rash.  Neurological: Negative for seizures and syncope.  Psychiatric/Behavioral: The patient is nervous/anxious.   All other systems reviewed and are negative.   Physical Exam Updated Vital Signs BP (!) 147/94   Pulse (!) 103   Temp 98.5 F (36.9 C) (Oral)   Resp 19   Ht 5\' 7"  (1.702 m)    Wt 83.5 kg   SpO2 99%   BMI 28.82 kg/m   Physical Exam Vitals and nursing note reviewed.  Constitutional:      General: She is not in acute distress.    Appearance: She is well-developed.  HENT:     Head: Normocephalic and atraumatic.  Eyes:     Conjunctiva/sclera: Conjunctivae normal.     Pupils: Pupils are equal, round, and reactive to light.  Cardiovascular:     Rate and Rhythm: Normal rate and regular rhythm.     Pulses:          Radial pulses are 2+ on the right side and 2+ on the left side.     Heart sounds: No murmur heard.   Pulmonary:     Effort: Pulmonary effort is normal. No respiratory distress.     Breath sounds: Normal breath sounds. No decreased breath sounds.  Abdominal:     Palpations: Abdomen is soft.     Tenderness: There is no abdominal tenderness.  Musculoskeletal:     Cervical back: Normal range of motion and neck supple.     Right lower leg: No edema.  Skin:    General: Skin is warm and dry.  Neurological:     Mental Status: She is alert.  Psychiatric:        Mood and Affect: Mood is anxious and depressed.        Thought Content: Thought content does not include homicidal or suicidal ideation. Thought content does not include homicidal or suicidal plan.     ED Results / Procedures / Treatments   Labs (all labs ordered are listed, but only abnormal results are displayed) Labs Reviewed  BASIC METABOLIC PANEL - Abnormal; Notable for the following components:      Result Value   Glucose, Bld 144 (*)    Creatinine, Ser 1.06 (*)    All other components within normal limits  CBC  TROPONIN I (HIGH SENSITIVITY)    EKG EKG Interpretation  Date/Time:  Monday October 06 2020 19:32:20 EDT Ventricular Rate:  113 PR Interval:  144 QRS Duration: 98 QT Interval:  332 QTC Calculation: 455 R Axis:   30 Text Interpretation: Sinus tachycardia with occasional Premature ventricular complexes Cannot rule out Anterior infarct , age undetermined Abnormal  ECG Confirmed by 07[birth year]1995 [phone]) on 10/06/2020 7:56:06 PM   Radiology DG Chest 2 View  Result Date: 10/06/2020 CLINICAL DATA:  Tachycardia EXAM: CHEST - 2 VIEW COMPARISON:  10/16/2019 FINDINGS: Lungs are well expanded, symmetric, and clear. No pneumothorax or pleural effusion. Cardiac size within normal limits. Pulmonary vascularity is normal. Osseous structures are age-appropriate. No acute bone abnormality. IMPRESSION: No active cardiopulmonary disease. Electronically Signed   By: 10/18/2019 MD   On: 10/06/2020 19:58    Procedures Procedures (including critical care time)  Medications Ordered in ED Medications  LORazepam (ATIVAN) tablet 1 mg (1 mg  Oral Given 10/06/20 2017)    ED Course  I have reviewed the triage vital signs and the nursing notes.  Pertinent labs & imaging results that were available during my care of the patient were reviewed by me and considered in my medical decision making (see chart for details).    MDM Rules/Calculators/A&P                          Sydnei Ohaver is a 49 year old female with history of asthma, anxiety presents the ED with chest pain.  Overall unremarkable vitals.  Was mildly tachycardic upon arrival but normal upon my evaluation.  Having a lot of stress in her life.  Recent break-up.  Chest pain and anxiety symptoms after smoking marijuana tonight.  EKG shows sinus rhythm.  Troponin normal.  No significant anemia, electrolyte abnormality.  Overall no suspicion for ACS or PE.  Suspect that this is stress related.  Patient felt better after Ativan.  Given reassurance and discharged in the ED in good condition.  Understands return precautions.  This chart was dictated using voice recognition software.  Despite best efforts to proofread,  errors can occur which can change the documentation meaning.    Final Clinical Impression(s) / ED Diagnoses Final diagnoses:  Nonspecific chest pain    Rx / DC Orders ED Discharge Orders     None       Virgina Norfolk, DO 10/06/20 2113

## 2020-10-06 NOTE — ED Triage Notes (Addendum)
Pt c/o CP, neck pain and pain to right parietal area x 30 min-sx started after smoking marijuana-pt NAD/anxious-states she does not smoke marijuana regularly but has had increase in stress-to triage in w/c

## 2020-12-02 ENCOUNTER — Emergency Department (HOSPITAL_BASED_OUTPATIENT_CLINIC_OR_DEPARTMENT_OTHER)
Admission: EM | Admit: 2020-12-02 | Discharge: 2020-12-03 | Disposition: A | Discharge status: Home / Self Care | Payer: BLUE CROSS/BLUE SHIELD | Attending: Emergency Medicine | Admitting: Emergency Medicine

## 2020-12-02 ENCOUNTER — Other Ambulatory Visit: Payer: Self-pay

## 2020-12-02 ENCOUNTER — Emergency Department (HOSPITAL_BASED_OUTPATIENT_CLINIC_OR_DEPARTMENT_OTHER): Payer: BLUE CROSS/BLUE SHIELD

## 2020-12-02 ENCOUNTER — Encounter (HOSPITAL_BASED_OUTPATIENT_CLINIC_OR_DEPARTMENT_OTHER): Payer: Self-pay | Admitting: *Deleted

## 2020-12-02 DIAGNOSIS — J45909 Unspecified asthma, uncomplicated: Secondary | ICD-10-CM | POA: Insufficient documentation

## 2020-12-02 DIAGNOSIS — U071 COVID-19: Secondary | ICD-10-CM | POA: Diagnosis not present

## 2020-12-02 DIAGNOSIS — R0789 Other chest pain: Secondary | ICD-10-CM | POA: Insufficient documentation

## 2020-12-02 DIAGNOSIS — R791 Abnormal coagulation profile: Secondary | ICD-10-CM | POA: Insufficient documentation

## 2020-12-02 DIAGNOSIS — R0602 Shortness of breath: Secondary | ICD-10-CM | POA: Insufficient documentation

## 2020-12-02 DIAGNOSIS — R079 Chest pain, unspecified: Secondary | ICD-10-CM

## 2020-12-02 LAB — BASIC METABOLIC PANEL
Anion gap: 9 (ref 5–15)
BUN: 17 mg/dL (ref 6–20)
CO2: 25 mmol/L (ref 22–32)
Calcium: 9 mg/dL (ref 8.9–10.3)
Chloride: 105 mmol/L (ref 98–111)
Creatinine, Ser: 0.86 mg/dL (ref 0.44–1.00)
GFR, Estimated: >60 mL/min (ref >60)
Glucose, Bld: 108 mg/dL — ABNORMAL HIGH (ref 70–99)
Potassium: 3.5 mmol/L (ref 3.5–5.1)
Sodium: 139 mmol/L (ref 135–145)

## 2020-12-02 LAB — CBC WITH DIFFERENTIAL/PLATELET
Abs Immature Granulocytes: 0.02 10*3/uL (ref 0.00–0.07)
Basophils Absolute: 0 10*3/uL (ref 0.0–0.1)
Basophils Relative: 0 %
Eosinophils Absolute: 0 10*3/uL (ref 0.0–0.5)
Eosinophils Relative: 1 %
HCT: 43 % (ref 36.0–46.0)
Hemoglobin: 14.3 g/dL (ref 12.0–15.0)
Immature Granulocytes: 0 %
Lymphocytes Relative: 29 %
Lymphs Abs: 1.8 10*3/uL (ref 0.7–4.0)
MCH: 28 pg (ref 26.0–34.0)
MCHC: 33.3 g/dL (ref 30.0–36.0)
MCV: 84.1 fL (ref 80.0–100.0)
Monocytes Absolute: 0.3 10*3/uL (ref 0.1–1.0)
Monocytes Relative: 5 %
Neutro Abs: 4.1 10*3/uL (ref 1.7–7.7)
Neutrophils Relative %: 65 %
Platelets: 325 10*3/uL (ref 150–400)
RBC: 5.11 MIL/uL (ref 3.87–5.11)
RDW: 12.5 % (ref 11.5–15.5)
WBC: 6.4 10*3/uL (ref 4.0–10.5)
nRBC: 0 % (ref 0.0–0.2)

## 2020-12-02 LAB — D-DIMER, QUANTITATIVE: D-Dimer, Quant: 0.59 ug/mL-FEU — ABNORMAL HIGH (ref 0.00–0.50)

## 2020-12-02 LAB — TROPONIN I (HIGH SENSITIVITY)
Troponin I (High Sensitivity): <2 ng/L (ref <18)
Troponin I (High Sensitivity): 2 ng/L (ref <18)

## 2020-12-02 MED ORDER — HYDROCORTISONE NA SUCCINATE PF 100 MG IJ SOLR
INTRAMUSCULAR | Status: AC
  Administered 2020-12-02: 200 mg
  Filled 2020-12-02: qty 4

## 2020-12-02 MED ORDER — HYDROCORTISONE NA SUCCINATE PF 250 MG IJ SOLR
200.0000 mg | Freq: Once | INTRAMUSCULAR | Status: DC
  Filled 2020-12-02: qty 200

## 2020-12-02 MED ORDER — DIPHENHYDRAMINE HCL 25 MG PO CAPS
50.0000 mg | ORAL_CAPSULE | Freq: Once | ORAL | Status: AC
  Administered 2020-12-02: 50 mg via ORAL
  Filled 2020-12-02: qty 2

## 2020-12-02 MED ORDER — DIPHENHYDRAMINE HCL 50 MG/ML IJ SOLN
50.0000 mg | Freq: Once | INTRAMUSCULAR | Status: AC
  Administered 2020-12-02: 50 mg via INTRAVENOUS
  Filled 2020-12-02: qty 1

## 2020-12-02 MED ORDER — IOHEXOL 350 MG/ML SOLN
100.0000 mL | Freq: Once | INTRAVENOUS | Status: AC | PRN
  Administered 2020-12-02: 100 mL via INTRAVENOUS

## 2020-12-02 NOTE — ED Notes (Signed)
D dimer was performed by her primary care MD, was informed by her MD that D Dimer was elevated and asked her to go the ED. Having some tightness in her chest, having a cough, non-productive cough, some SOB primarily when walking.

## 2020-12-02 NOTE — ED Notes (Signed)
12 Lead ECG obtained

## 2020-12-02 NOTE — ED Provider Notes (Signed)
[facility] EMERGENCY DEPARTMENT Provider Note   CSN: [id number] Arrival date & time: 12/02/20  1814     History Chief Complaint  Patient presents with  . D-Dimer+    Latoya Schmidt is a 49 y.o. female.  Latoya Schmidt is a 49 y.o. female with a history of anxiety, asthma and IBS, who presents to the emergency department for evaluation of elevated D-dimer. She saw her PCP today for evaluation of 4 days of intermittent central chest pain and pressure, she also reports that intermittently she has some sharp chest pain that is worse with deep breath. This is associated with shortness of breath. She has had intermittent cough. Reports some intermittent bilateral lower extremity pain but no swelling, erythema or warmth. Patient tested positive for Covid on 11/27 at home after symptoms began on 11/24. Patient has been vaccinated and symptoms initially were overall mild until she developed this chest pain and shortness of breath. PCP sent lab work including a D-dimer which came back elevated at 700 (0.7). She has no prior history of PE or DVT, aside from Covid she has no other risk factors, no recent long distance travel, surgery, hospitalization, she is not on any OCPs. Reports history of reactive airway disease that often flares up when she is sick and wonders if this can be contributing, she has not been on any steroids or antibiotics. No cardiac history.        Past Medical History:  Diagnosis Date  . Anxiety   . Asthma   . IBS (irritable bowel syndrome)   . Ovarian cyst     Patient Active Problem List   Diagnosis Date Noted  . Dyspnea on exertion 01/17/2020  . Atypical chest pain 01/30/2019  . Mixed dyslipidemia 01/30/2019  . Elevated BP without diagnosis of hypertension 01/12/2019  . Breast mass, right 09/11/2018  . Breast pain, left 08/30/2018  . Pap smear of cervix shows high risk HPV present 08/22/2017  . Current moderate episode of major depressive disorder  without prior episode (HCC) 07/29/2017  . Low grade squamous intraepithelial lesion (LGSIL) on Papanicolaou smear of cervix 07/29/2017  . ADD (attention deficit disorder) 12/18/2015  . Asthma 12/18/2015  . Generalized anxiety disorder 12/18/2015  . IBS (irritable bowel syndrome) 12/18/2015  . Mild intermittent asthma without complication 12/18/2015  . Mixed hyperlipidemia 12/18/2015    Past Surgical History:  Procedure Laterality Date  . CESAREAN SECTION    . CHOLECYSTECTOMY    . ERCP    . ERCP    . OVARIAN CYST REMOVAL    . TUBAL LIGATION       OB History    Gravida  2   Para  2   Term      Preterm      AB      Living        SAB      TAB      Ectopic      Multiple      Live Births              History reviewed. No pertinent family history.  Social History   Tobacco Use  . Smoking status: Never Smoker  . Smokeless tobacco: Never Used  Vaping Use  . Vaping Use: Never used  Substance Use Topics  . Alcohol use: Not Currently  . Drug use: Yes    Types: Marijuana    Home Medications Prior to Admission medications   Medication Sig Start Date End Date  Taking? Authorizing Provider  albuterol (PROVENTIL HFA;VENTOLIN HFA) 108 (90 Base) MCG/ACT inhaler Inhale 2 puffs into the lungs every 6 (six) hours as needed for wheezing or shortness of breath.    [provider]  montelukast (SINGULAIR) 10 MG tablet Take 10 mg by mouth daily. 06/04/20   [provider]  Multiple Vitamin (MULITIVITAMIN WITH MINERALS) TABS Take 1 tablet by mouth daily.    [provider]  Omega-3 Fatty Acids (FISH OIL PO) Take by mouth.    [provider]  predniSONE (DELTASONE) 20 MG tablet Take 3 tablets (60 mg total) by mouth daily for 5 days. 12/03/20 12/08/20  Dartha Lodge, PA-C  Triprolidine-Pseudoephedrine (ANTIHISTAMINE DECONGESTANT PO) Take by mouth.    [provider]    Allergies    Iodine-131, Nitrofurantoin macrocrystal, and  Ivp dye [iodinated diagnostic agents]  Review of Systems   Review of Systems  Constitutional: Positive for fatigue. Negative for chills and fever.  HENT: Negative.   Respiratory: Positive for cough and shortness of breath.   Cardiovascular: Positive for chest pain. Negative for palpitations and leg swelling.  Gastrointestinal: Negative for abdominal pain, nausea and vomiting.  Musculoskeletal: Positive for myalgias. Negative for arthralgias and joint swelling.  Skin: Negative for color change and rash.  Neurological: Negative for syncope, weakness, light-headedness, numbness and headaches.  All other systems reviewed and are negative.   Physical Exam Updated Vital Signs BP (!) 123/92 (BP Location: Right Arm)   Pulse 98   Temp 98.3 F (36.8 C) (Oral)   Resp 18   Ht 5\' 6"  (1.676 m)   Wt 82.6 kg   SpO2 99%   BMI 29.38 kg/m   Physical Exam Vitals and nursing note reviewed.  Constitutional:      General: She is not in acute distress.    Appearance: Normal appearance. She is well-developed. She is not ill-appearing or diaphoretic.  HENT:     Head: Normocephalic and atraumatic.     Mouth/Throat:     Mouth: Mucous membranes are moist.     Pharynx: Oropharynx is clear.  Eyes:     General:        Right eye: No discharge.        Left eye: No discharge.     Pupils: Pupils are equal, round, and reactive to light.  Cardiovascular:     Rate and Rhythm: Normal rate and regular rhythm.     Pulses: Normal pulses.     Heart sounds: Normal heart sounds. No murmur heard.  No friction rub. No gallop.   Pulmonary:     Effort: Pulmonary effort is normal. No respiratory distress.     Breath sounds: Normal breath sounds. No wheezing or rales.     Comments: Respirations equal and unlabored, patient able to speak in full sentences, lungs clear to auscultation bilaterally Abdominal:     General: Bowel sounds are normal. There is no distension.     Palpations: Abdomen is soft. There is no  mass.     Tenderness: There is no abdominal tenderness. There is no guarding.     Comments: Abdomen soft, nondistended, nontender to palpation in all quadrants without guarding or peritoneal signs   Musculoskeletal:        General: No deformity.     Cervical back: Neck supple.     Right lower leg: No edema.     Left lower leg: No edema.  Skin:    General: Skin is warm and dry.  Capillary Refill: Capillary refill takes less than 2 seconds.  Neurological:     Mental Status: She is alert and oriented to person, place, and time.     Coordination: Coordination normal.     Comments: Speech is clear, able to follow commands Moves extremities without ataxia, coordination intact  Psychiatric:        Mood and Affect: Mood normal.        Behavior: Behavior normal.     ED Results / Procedures / Treatments   Labs (all labs ordered are listed, but only abnormal results are displayed) Labs Reviewed  BASIC METABOLIC PANEL - Abnormal; Notable for the following components:      Result Value   Glucose, Bld 108 (*)    All other components within normal limits  D-DIMER, QUANTITATIVE (NOT AT [facility]) - Abnormal; Notable for the following components:   D-Dimer, Quant 0.59 (*)    All other components within normal limits  CBC WITH DIFFERENTIAL/PLATELET  TROPONIN I (HIGH SENSITIVITY)  TROPONIN I (HIGH SENSITIVITY)    EKG EKG Interpretation  Date/Time:  Tuesday December 02 2020 18:36:32 EST Ventricular Rate:  90 PR Interval:    QRS Duration: 103 QT Interval:  354 QTC Calculation: 434 R Axis:   106 Text Interpretation: Sinus rhythm Right axis deviation Low voltage, precordial leads Confirmed by Tilden Fossa [phone]) on 12/02/2020 6:45:26 PM   Radiology CT Angio Chest PE W and/or Wo Contrast  Result Date: 12/02/2020 CLINICAL DATA:  COVID positive with chest pain. EXAM: CT ANGIOGRAPHY CHEST WITH CONTRAST TECHNIQUE: Multidetector CT imaging of the chest was performed using the standard  protocol during bolus administration of intravenous contrast. Multiplanar CT image reconstructions and MIPs were obtained to evaluate the vascular anatomy. CONTRAST:  OMNIPAQUE IOHEXOL 350 MG/ML SOLN COMPARISON:  February 02, 2019 FINDINGS: Cardiovascular: The ascending thoracic aorta measures 3.5 cm x 3.3 cm and is stable in size and appearance when compared to the prior study. Satisfactory opacification of the pulmonary arteries to the segmental level. No evidence of pulmonary embolism. Normal heart size. No pericardial effusion. Mediastinum/Nodes: No enlarged mediastinal, hilar, or axillary lymph nodes. Thyroid gland, trachea, and esophagus demonstrate no significant findings. Lungs/Pleura: Lungs are clear. No pleural effusion or pneumothorax. Upper Abdomen: No acute abnormality. Musculoskeletal: No chest wall abnormality. No acute or significant osseous findings. Review of the MIP images confirms the above findings. IMPRESSION: 1. No CT evidence of pulmonary embolism or acute cardiopulmonary disease. Electronically Signed   By: Aram Candela M.D.   On: 12/02/2020 23:44   DG Chest Portable 1 View  Result Date: 12/02/2020 CLINICAL DATA:  Nonproductive EXAM: PORTABLE CHEST 1 VIEW COMPARISON:  None. FINDINGS: The heart size and mediastinal contours are within normal limits. Both lungs are clear. The visualized skeletal structures are unremarkable. IMPRESSION: No active disease. Electronically Signed   By: Jonna Clark M.D.   On: 12/02/2020 19:50    Procedures Procedures (including critical care time)  Medications Ordered in ED Medications  hydrocortisone sodium succinate (SOLU-CORTEF) injection 200 mg (has no administration in time range)  diphenhydrAMINE (BENADRYL) capsule 50 mg ( Oral See Alternative 12/02/20 2242)    Or  diphenhydrAMINE (BENADRYL) injection 50 mg (50 mg Intravenous Given 12/02/20 2242)  hydrocortisone sodium succinate (SOLU-CORTEF) 100 MG injection (200 mg  Given 12/02/20  1944)  iohexol (OMNIPAQUE) 350 MG/ML injection 100 mL (100 mLs Intravenous Contrast Given 12/02/20 2329)    ED Course  I have reviewed the triage vital signs and the nursing  notes.  Pertinent labs & imaging results that were available during my care of the patient were reviewed by me and considered in my medical decision making (see chart for details).    MDM Rules/Calculators/A&P                          Patient sent from PCPs office for evaluation of chest pain and shortness of breath with elevated D-dimer.on lab work today. She had recent Covid infection, symptoms started on 11/24, she tested positive at home on 11/27 and initially had mild course but 4 days ago developed central chest pain and shortness of breath that has been intermittent but never completely resolving. No prior cardiac history, does have history of reactive airway disease. Given elevated D-dimer as an outpatient will proceed with CT angio of the chest, will also check basic labs, troponins, EKG and chest x-ray. A repeat D-dimer was ordered by nursing staff during triage. Patient has a contrast allergy and will require premedication prior to having CT.  I have independently ordered, reviewed and interpreted all labs and imaging:  CBC: No leukocytosis, normal hemoglobin BMP: Glucose of 108 but no other electrolyte derangements, normal renal function Troponin: Negative x2 D-dimer: Elevated, CT pending EKG: Sinus rhythm with no concerning ischemic changes. Chest x-ray: No active cardiopulmonary disease.  CTA with no evidence of PE or other active cardiopulmonary disease. Suspect symptoms are likely related to reactive airway disease and Covid infection. Will treat with course of steroids, patient has inhalers to use at home as well. She is also complained of some muscular pain and cramping in her legs and is worried she could have blood clots in her legs, she does not have any swelling or overlying skin changes, will send for  outpatient DVT studies, but overall have low suspicion for DVT, given that we know she does not have any PE do not feel it is necessary to anticoagulate tonight. She will follow up in the morning for ultrasound. Discussed PCP follow-up and return precautions. She expresses understanding and agreement. Discharged home in good condition.  Final Clinical Impression(s) / ED Diagnoses Final diagnoses:  Central chest pain  SOB (shortness of breath)  COVID-19 virus infection    Rx / DC Orders ED Discharge Orders         Ordered    predniSONE (DELTASONE) 20 MG tablet  Daily        12/03/20 0013    US Venous Img Lower Bilateral        12/03/20 0013           Dartha LodgeFord, Rachelanne Whidby N, PA-C 12/03/20 0039    Tilden Fossaees, Elizabeth, MD 12/07/20 1857

## 2020-12-02 NOTE — ED Triage Notes (Signed)
Was Covid + on the 27th of November.  Went to MD's office follow up d/t chest pain and D-dimer is positive.

## 2020-12-02 NOTE — ED Notes (Signed)
ED Provider at bedside. 

## 2020-12-02 NOTE — ED Notes (Signed)
Placed on cont cardiac monitor with cont POX monitoring and int NBP assessments

## 2020-12-02 NOTE — ED Notes (Signed)
Pt on monitors and vitals cycling

## 2020-12-03 MED ORDER — PREDNISONE 20 MG PO TABS: 60.0000 mg | ORAL_TABLET | Freq: Every day | ORAL | 0 refills | Status: AC

## 2020-12-03 NOTE — Discharge Instructions (Addendum)
We do not see any evidence of blood clot on your CT scan today, symptoms are likely related to Covid infection, the rest of your lab work has been reassuring as well.  Please take prednisone and use inhalers at home.  Follow-up closely with your primary care doctor.  I suspect your leg pain is likely related to muscle aches from Covid, but could also be DVT, please follow-up tomorrow for outpatient ultrasounds to assess for possible DVT.

## 2020-12-04 ENCOUNTER — Encounter (HOSPITAL_BASED_OUTPATIENT_CLINIC_OR_DEPARTMENT_OTHER): Payer: Self-pay | Admitting: Emergency Medicine

## 2020-12-04 ENCOUNTER — Emergency Department (HOSPITAL_BASED_OUTPATIENT_CLINIC_OR_DEPARTMENT_OTHER)
Admission: EM | Admit: 2020-12-04 | Discharge: 2020-12-05 | Disposition: A | Discharge status: Home / Self Care | Payer: BLUE CROSS/BLUE SHIELD | Attending: Emergency Medicine | Admitting: Emergency Medicine

## 2020-12-04 ENCOUNTER — Other Ambulatory Visit: Payer: Self-pay

## 2020-12-04 DIAGNOSIS — T782XXA Anaphylactic shock, unspecified, initial encounter: Secondary | ICD-10-CM | POA: Insufficient documentation

## 2020-12-04 DIAGNOSIS — J4520 Mild intermittent asthma, uncomplicated: Secondary | ICD-10-CM | POA: Insufficient documentation

## 2020-12-04 DIAGNOSIS — T7840XA Allergy, unspecified, initial encounter: Secondary | ICD-10-CM | POA: Diagnosis present

## 2020-12-04 MED ORDER — SODIUM CHLORIDE 0.9 % IV SOLN
INTRAVENOUS | Status: DC
Start: 2020-12-04 — End: 2020-12-05

## 2020-12-04 MED ORDER — FAMOTIDINE IN NACL 20-0.9 MG/50ML-% IV SOLN
20.0000 mg | Freq: Once | INTRAVENOUS | Status: AC
  Administered 2020-12-04: 20 mg via INTRAVENOUS
  Filled 2020-12-04: qty 50

## 2020-12-04 MED ORDER — DIPHENHYDRAMINE HCL 25 MG PO CAPS
25.0000 mg | ORAL_CAPSULE | Freq: Once | ORAL | Status: AC
  Administered 2020-12-04: 25 mg via ORAL
  Filled 2020-12-04: qty 1

## 2020-12-04 MED ORDER — DEXAMETHASONE SODIUM PHOSPHATE 10 MG/ML IJ SOLN
10.0000 mg | Freq: Once | INTRAMUSCULAR | Status: DC
Start: 2020-12-04 — End: 2020-12-04
  Filled 2020-12-04: qty 1

## 2020-12-04 MED ORDER — DIPHENHYDRAMINE HCL 50 MG/ML IJ SOLN
25.0000 mg | Freq: Once | INTRAMUSCULAR | Status: AC
  Administered 2020-12-04: 25 mg via INTRAVENOUS
  Filled 2020-12-04: qty 1

## 2020-12-04 MED ORDER — SODIUM CHLORIDE 0.9 % IV BOLUS
1000.0000 mL | Freq: Once | INTRAVENOUS | Status: AC
  Administered 2020-12-04: 1000 mL via INTRAVENOUS

## 2020-12-04 NOTE — ED Notes (Signed)
Pt with increased redness and hives across her chest. Pt reports her throat now feels scratchy. Denies SOB

## 2020-12-04 NOTE — ED Provider Notes (Signed)
[facility] EMERGENCY DEPARTMENT Provider Note   CSN: [id number] Arrival date & time: 12/04/20  1648     History Chief Complaint  Patient presents with  . Allergic Reaction    Latoya Schmidt is a 49 y.o. female.  HPI 49 year old female with history of anxiety, asthma, MDD presents to the ER with complaints of allergic reaction.  Patient reports known allergy to contrast dye, was seen in the ER 2 days ago with a positive D-dimer that was ordered at her PCPs office.  She underwent the 8 hour prep for dye, and was sent home with steroids.  She started the steroids yesterday.  She states yesterday she started to develop a rash, and today it got progressively worse.  Has severe itching and burning.  She was seen at her PCPs office in urgent care, was given Decadron and Benadryl, but was told to come to the ER.  She denies any throat closing, shortness of breath.  Endorses a mild scratchy throat no difficulty breathing.  Past Medical History:  Diagnosis Date  . Anxiety   . Asthma   . IBS (irritable bowel syndrome)   . Ovarian cyst     Patient Active Problem List   Diagnosis Date Noted  . Dyspnea on exertion 01/17/2020  . Atypical chest pain 01/30/2019  . Mixed dyslipidemia 01/30/2019  . Elevated BP without diagnosis of hypertension 01/12/2019  . Breast mass, right 09/11/2018  . Breast pain, left 08/30/2018  . Pap smear of cervix shows high risk HPV present 08/22/2017  . Current moderate episode of major depressive disorder without prior episode (HCC) 07/29/2017  . Low grade squamous intraepithelial lesion (LGSIL) on Papanicolaou smear of cervix 07/29/2017  . ADD (attention deficit disorder) 12/18/2015  . Asthma 12/18/2015  . Generalized anxiety disorder 12/18/2015  . IBS (irritable bowel syndrome) 12/18/2015  . Mild intermittent asthma without complication 12/18/2015  . Mixed hyperlipidemia 12/18/2015    Past Surgical History:  Procedure Laterality Date  .  CESAREAN SECTION    . CHOLECYSTECTOMY    . ERCP    . ERCP    . OVARIAN CYST REMOVAL    . TUBAL LIGATION       OB History    Gravida  2   Para  2   Term      Preterm      AB      Living        SAB      IAB      Ectopic      Multiple      Live Births              No family history on file.  Social History   Tobacco Use  . Smoking status: Never Smoker  . Smokeless tobacco: Never Used  Vaping Use  . Vaping Use: Never used  Substance Use Topics  . Alcohol use: Not Currently  . Drug use: Yes    Types: Marijuana    Home Medications Prior to Admission medications   Medication Sig Start Date End Date Taking? Authorizing Provider  albuterol (PROVENTIL HFA;VENTOLIN HFA) 108 (90 Base) MCG/ACT inhaler Inhale 2 puffs into the lungs every 6 (six) hours as needed for wheezing or shortness of breath.    [provider]  montelukast (SINGULAIR) 10 MG tablet Take 10 mg by mouth daily. 06/04/20   [provider]  Multiple Vitamin (MULITIVITAMIN WITH MINERALS) TABS Take 1 tablet by mouth daily.    [provider]  Omega-3 Fatty Acids (FISH OIL PO) Take by mouth.    [provider]  predniSONE (DELTASONE) 20 MG tablet Take 3 tablets (60 mg total) by mouth daily for 5 days. 12/03/20 12/08/20  Dartha Lodge, PA-C  Triprolidine-Pseudoephedrine (ANTIHISTAMINE DECONGESTANT PO) Take by mouth.    [provider]    Allergies    Iodine-131, Nitrofurantoin macrocrystal, and Ivp dye [iodinated diagnostic agents]  Review of Systems   Review of Systems  Constitutional: Negative for fever.  HENT: Negative for trouble swallowing and voice change.   Respiratory: Negative for shortness of breath.   Cardiovascular: Negative for chest pain.  Gastrointestinal: Positive for diarrhea.  Skin: Positive for rash.    Physical Exam Updated Vital Signs BP 120/87   Pulse 81   Temp 98.2 F (36.8 C) (Oral)   Resp 16   SpO2 100%   Physical  Exam Vitals and nursing note reviewed.  Constitutional:      General: She is not in acute distress.    Appearance: She is well-developed and well-nourished. She is not ill-appearing or diaphoretic.  HENT:     Head: Normocephalic and atraumatic.     Mouth/Throat:     Comments: Oropharynx non erythematous without exudates, uvula midline, no unilateral tonsillar swelling, tongue normal size and midline, no sublingual/submandibular swellimg, tolerating secretions well    Eyes:     Conjunctiva/sclera: Conjunctivae normal.  Cardiovascular:     Rate and Rhythm: Normal rate and regular rhythm.     Pulses: Normal pulses.     Heart sounds: Normal heart sounds. No murmur heard.   Pulmonary:     Effort: Pulmonary effort is normal. No respiratory distress.     Breath sounds: Normal breath sounds. No stridor. No wheezing, rhonchi or rales.  Chest:     Chest wall: No tenderness.  Abdominal:     Palpations: Abdomen is soft.     Tenderness: There is no abdominal tenderness.  Musculoskeletal:        General: No tenderness or edema. Normal range of motion.     Cervical back: Neck supple.  Skin:    General: Skin is warm and dry.     Findings: No erythema or lesion.     Comments: Uniform erythematous rash to her upper extremities and chest. No evidence of bullae, sloughing, blisters, pustules, mild warmth,  No  draining sinus tracts, no superficial abscesses, no vesicles, desquamation, no target lesions with dusky purpura or central bulla.  Not tender to touch.    Neurological:     General: No focal deficit present.     Mental Status: She is alert and oriented to person, place, and time.  Psychiatric:        Mood and Affect: Mood and affect and mood normal.        Behavior: Behavior normal.     ED Results / Procedures / Treatments   Labs (all labs ordered are listed, but only abnormal results are displayed) Labs Reviewed  PREGNANCY, URINE     EKG None  Radiology  Procedures Procedures (including critical care time)  Medications Ordered in ED Medications  sodium chloride 0.9 % bolus 1,000 mL (1,000 mLs Intravenous New Bag/Given 12/04/20 2114)    And  0.9 %  sodium chloride infusion (has no administration in time range)  diphenhydrAMINE (BENADRYL) capsule 25 mg (25 mg Oral Given 12/04/20 1919)  diphenhydrAMINE (BENADRYL) injection 25 mg (25 mg Intravenous Given 12/04/20 2120)  famotidine (PEPCID) IVPB 20 mg  premix (0 mg Intravenous Stopped 12/04/20 2150)    ED Course  I have reviewed the triage vital signs and the nursing notes.  Pertinent labs & imaging results that were available during my care of the patient were reviewed by me and considered in my medical decision making (see chart for details).    MDM Rules/Calculators/A&P                          49 year old female who presents with an anaphylactic reaction to contrast dye.  Airway intact, speaking in full sentences without wheezes.  Notable uniform bright red rash to the chest and arms.  Vitals overall reassuring.  Initiated ED allergic reaction protocol, including fluids, Benadryl, Pepcid.  Patient had received 10 of Decadron earlier today.  Patient was monitored in the ER for over 4 hours.  Patient notes significant improvement in itching and rash during this time period.  Continues to maintain airway, no throat closing, shortness of breath.  Patient was encouraged to continue to take her steroids until finished, Benadryl as needed.  Return precautions discussed.  At this stage in the ED course, the patient is medically screened and stable for discharge. Final Clinical Impression(s) / ED Diagnoses Final diagnoses:  None    Rx / DC Orders ED Discharge Orders    None       Leone Brand 12/07/20 1035    Arby Barrette, MD 12/13/20 [phone]

## 2020-12-04 NOTE — ED Triage Notes (Addendum)
She is allergic to IV dye. She had a CT scan 2 days ago. States she has had hives. Denies SOB. She saw her dr today and was given IM steroids and sent here for IV fluids. Last benadryl at 1pm

## 2020-12-05 NOTE — Discharge Instructions (Signed)
Please continue to take the steroid pack as directed until finished.  Please continue to take Benadryl as needed.  Please return to the ER for any new or worsening symptoms.

## 2021-03-08 ENCOUNTER — Emergency Department (HOSPITAL_BASED_OUTPATIENT_CLINIC_OR_DEPARTMENT_OTHER): Payer: BC Managed Care – PPO

## 2021-03-08 ENCOUNTER — Emergency Department (HOSPITAL_BASED_OUTPATIENT_CLINIC_OR_DEPARTMENT_OTHER)
Admission: EM | Admit: 2021-03-08 | Discharge: 2021-03-08 | Disposition: A | Discharge status: Home / Self Care | Payer: BC Managed Care – PPO | Attending: Emergency Medicine | Admitting: Emergency Medicine

## 2021-03-08 ENCOUNTER — Encounter (HOSPITAL_BASED_OUTPATIENT_CLINIC_OR_DEPARTMENT_OTHER): Payer: Self-pay

## 2021-03-08 ENCOUNTER — Other Ambulatory Visit: Payer: Self-pay

## 2021-03-08 DIAGNOSIS — R1033 Periumbilical pain: Secondary | ICD-10-CM | POA: Diagnosis not present

## 2021-03-08 DIAGNOSIS — R1031 Right lower quadrant pain: Secondary | ICD-10-CM | POA: Diagnosis not present

## 2021-03-08 DIAGNOSIS — J4520 Mild intermittent asthma, uncomplicated: Secondary | ICD-10-CM | POA: Diagnosis not present

## 2021-03-08 DIAGNOSIS — R1011 Right upper quadrant pain: Secondary | ICD-10-CM | POA: Insufficient documentation

## 2021-03-08 DIAGNOSIS — R110 Nausea: Secondary | ICD-10-CM | POA: Insufficient documentation

## 2021-03-08 DIAGNOSIS — R319 Hematuria, unspecified: Secondary | ICD-10-CM | POA: Insufficient documentation

## 2021-03-08 DIAGNOSIS — Z79899 Other long term (current) drug therapy: Secondary | ICD-10-CM | POA: Insufficient documentation

## 2021-03-08 DIAGNOSIS — R109 Unspecified abdominal pain: Secondary | ICD-10-CM

## 2021-03-08 LAB — LIPASE, BLOOD: Lipase: 28 U/L (ref 11–51)

## 2021-03-08 LAB — CBC WITH DIFFERENTIAL/PLATELET
Abs Immature Granulocytes: 0.03 10*3/uL (ref 0.00–0.07)
Basophils Absolute: 0 10*3/uL (ref 0.0–0.1)
Basophils Relative: 0 %
Eosinophils Absolute: 0 10*3/uL (ref 0.0–0.5)
Eosinophils Relative: 0 %
HCT: 43.4 % (ref 36.0–46.0)
Hemoglobin: 14.4 g/dL (ref 12.0–15.0)
Immature Granulocytes: 0 %
Lymphocytes Relative: 25 %
Lymphs Abs: 1.9 10*3/uL (ref 0.7–4.0)
MCH: 28.2 pg (ref 26.0–34.0)
MCHC: 33.2 g/dL (ref 30.0–36.0)
MCV: 85.1 fL (ref 80.0–100.0)
Monocytes Absolute: 0.4 10*3/uL (ref 0.1–1.0)
Monocytes Relative: 5 %
Neutro Abs: 5.2 10*3/uL (ref 1.7–7.7)
Neutrophils Relative %: 70 %
Platelets: 314 10*3/uL (ref 150–400)
RBC: 5.1 MIL/uL (ref 3.87–5.11)
RDW: 12.5 % (ref 11.5–15.5)
WBC: 7.5 10*3/uL (ref 4.0–10.5)
nRBC: 0 % (ref 0.0–0.2)

## 2021-03-08 LAB — COMPREHENSIVE METABOLIC PANEL
ALT: 26 U/L (ref 0–44)
AST: 22 U/L (ref 15–41)
Albumin: 3.9 g/dL (ref 3.5–5.0)
Alkaline Phosphatase: 42 U/L (ref 38–126)
Anion gap: 8 (ref 5–15)
BUN: 13 mg/dL (ref 6–20)
CO2: 25 mmol/L (ref 22–32)
Calcium: 8.9 mg/dL (ref 8.9–10.3)
Chloride: 103 mmol/L (ref 98–111)
Creatinine, Ser: 0.83 mg/dL (ref 0.44–1.00)
GFR, Estimated: >60 mL/min (ref >60)
Glucose, Bld: 88 mg/dL (ref 70–99)
Potassium: 3.6 mmol/L (ref 3.5–5.1)
Sodium: 136 mmol/L (ref 135–145)
Total Bilirubin: 0.6 mg/dL (ref 0.3–1.2)
Total Protein: 7.4 g/dL (ref 6.5–8.1)

## 2021-03-08 MED ORDER — FENTANYL CITRATE (PF) 100 MCG/2ML IJ SOLN
50.0000 ug | Freq: Once | INTRAMUSCULAR | Status: DC
Start: 2021-03-08 — End: 2021-03-08
  Filled 2021-03-08: qty 2

## 2021-03-08 MED ORDER — ONDANSETRON HCL 4 MG/2ML IJ SOLN
4.0000 mg | Freq: Once | INTRAMUSCULAR | Status: DC
  Filled 2021-03-08: qty 2

## 2021-03-08 NOTE — Discharge Instructions (Signed)
Follow-up with your specialist specialist as previously scheduled.  Follow-up with your primary care doctor.  Return to the Emergency Department immediately if you experience any worsening abdominal pain, fever, persistent nausea and vomiting, inability keep any food down, pain with urination, blood in your urine or any other worsening or concerning symptoms.

## 2021-03-08 NOTE — ED Triage Notes (Signed)
Pt arrives with c/o flank pain, States that she was seen at [facility], thought she may have UTI, Provider at [facility] had concern for appendicitis and told her to come to ED for CT scan.

## 2021-03-08 NOTE — ED Provider Notes (Signed)
[facility] EMERGENCY DEPARTMENT Provider Note   CSN: [id number] Arrival date & time: 03/08/21  1429     History Chief Complaint  Patient presents with  . Flank Pain    Latoya Schmidt is a 50 y.o. female possible history of anxiety, asthma, IBS who presents for evaluation of hematuria, right-sided abdominal pain.  She reports about a week ago, she started having some hematuria.  She denies any associated dysuria.  She reports that then she started having some right abdominal pain.  She states occasionally, she will get pain in the right upper quadrant which she states she was told was scar tissue from her gallbladder surgery.  She reports that it started off in the mid abdomen and started spreading to the lower abdomen and into the back.  She had had some nausea.  She has not any vomiting.  She does state that sometimes when she ate it would make her pain worse.  Other times it would not affect the pain.  She went to urgent care earlier today and was evaluated and told to come the emergency department for evaluation of appendicitis.  She states she has had some diarrhea.  She does have history of IBS and states she does have flares but does not know if this feels like a flare.  She has not noted any fevers.  She denies any chest pain, difficulty breathing.  She has been told she had a kidney stone in her kidney but cannot remember what side.  The history is provided by the patient.       Past Medical History:  Diagnosis Date  . Anxiety   . Asthma   . IBS (irritable bowel syndrome)   . Ovarian cyst     Patient Active Problem List   Diagnosis Date Noted  . Dyspnea on exertion 01/17/2020  . Atypical chest pain 01/30/2019  . Mixed dyslipidemia 01/30/2019  . Elevated BP without diagnosis of hypertension 01/12/2019  . Breast mass, right 09/11/2018  . Breast pain, left 08/30/2018  . Pap smear of cervix shows high risk HPV present 08/22/2017  . Current moderate episode of  major depressive disorder without prior episode (HCC) 07/29/2017  . Low grade squamous intraepithelial lesion (LGSIL) on Papanicolaou smear of cervix 07/29/2017  . ADD (attention deficit disorder) 12/18/2015  . Asthma 12/18/2015  . Generalized anxiety disorder 12/18/2015  . IBS (irritable bowel syndrome) 12/18/2015  . Mild intermittent asthma without complication 12/18/2015  . Mixed hyperlipidemia 12/18/2015    Past Surgical History:  Procedure Laterality Date  . CESAREAN SECTION    . CHOLECYSTECTOMY    . ERCP    . ERCP    . OVARIAN CYST REMOVAL    . TUBAL LIGATION       OB History    Gravida  2   Para  2   Term      Preterm      AB      Living        SAB      IAB      Ectopic      Multiple      Live Births              No family history on file.  Social History   Tobacco Use  . Smoking status: Never Smoker  . Smokeless tobacco: Never Used  Vaping Use  . Vaping Use: Never used  Substance Use Topics  . Alcohol use: Not Currently  . Drug  use: Not Currently    Types: Marijuana    Home Medications Prior to Admission medications   Medication Sig Start Date End Date Taking? Authorizing Provider  albuterol (PROVENTIL HFA;VENTOLIN HFA) 108 (90 Base) MCG/ACT inhaler Inhale 2 puffs into the lungs every 6 (six) hours as needed for wheezing or shortness of breath.   Yes [provider]  montelukast (SINGULAIR) 10 MG tablet Take 10 mg by mouth daily. 06/04/20  Yes [provider]  Multiple Vitamin (MULITIVITAMIN WITH MINERALS) TABS Take 1 tablet by mouth daily.   Yes [provider]  Omega-3 Fatty Acids (FISH OIL PO) Take by mouth.   Yes [provider]  omeprazole (PRILOSEC) 20 MG capsule Take 20 mg by mouth daily.   Yes [provider]  Triprolidine-Pseudoephedrine (ANTIHISTAMINE DECONGESTANT PO) Take by mouth.    [provider]    Allergies    Iodine-131, Nitrofurantoin macrocrystal, and Ivp dye  [iodinated diagnostic agents]  Review of Systems   Review of Systems  Constitutional: Negative for fever.  Respiratory: Negative for cough and shortness of breath.   Cardiovascular: Negative for chest pain.  Gastrointestinal: Positive for abdominal pain and nausea. Negative for vomiting.  Genitourinary: Positive for hematuria. Negative for dysuria.  Neurological: Negative for headaches.  All other systems reviewed and are negative.   Physical Exam Updated Vital Signs BP 110/71   Pulse 65   Temp 97.7 F (36.5 C) (Oral)   Resp 18   Ht 5\' 6"  (1.676 m)   Wt 86.2 kg   SpO2 100%   BMI 30.67 kg/m   Physical Exam Vitals and nursing note reviewed.  Constitutional:      Appearance: Normal appearance. She is well-developed.  HENT:     Head: Normocephalic and atraumatic.  Eyes:     General: Lids are normal.     Conjunctiva/sclera: Conjunctivae normal.     Pupils: Pupils are equal, round, and reactive to light.  Cardiovascular:     Rate and Rhythm: Normal rate and regular rhythm.     Pulses: Normal pulses.     Heart sounds: Normal heart sounds. No murmur heard. No friction rub. No gallop.   Pulmonary:     Effort: Pulmonary effort is normal.     Breath sounds: Normal breath sounds.  Abdominal:     Palpations: Abdomen is soft. Abdomen is not rigid.     Tenderness: There is abdominal tenderness in the right upper quadrant, right lower quadrant and periumbilical area. There is no right CVA tenderness, left CVA tenderness or guarding.     Comments: Abdomen soft, nondistended.  Tenderness palpation diffusely to the right side.  No focal tenderness noted at McBurney's point.  No CVA tenderness.  No rigidity, guarding.  Musculoskeletal:        General: Normal range of motion.     Cervical back: Full passive range of motion without pain.  Skin:    General: Skin is warm and dry.     Capillary Refill: Capillary refill takes less than 2 seconds.  Neurological:     Mental Status: She is  alert and oriented to person, place, and time.  Psychiatric:        Speech: Speech normal.     ED Results / Procedures / Treatments   Labs (all labs ordered are listed, but only abnormal results are displayed) Labs Reviewed  COMPREHENSIVE METABOLIC PANEL  CBC WITH DIFFERENTIAL/PLATELET  LIPASE, BLOOD    EKG None  Radiology CT ABDOMEN PELVIS WO  CONTRAST  Result Date: 03/08/2021 CLINICAL DATA:  Abdominal pain, bloating, nausea, hematuria EXAM: CT ABDOMEN AND PELVIS WITHOUT CONTRAST TECHNIQUE: Multidetector CT imaging of the abdomen and pelvis was performed following the standard protocol without IV contrast. COMPARISON:  10/08/2020 FINDINGS: Lower chest: Lung bases are clear.  Heart size is normal. Hepatobiliary: No focal liver abnormality is seen. Status post cholecystectomy. No biliary dilatation. Pancreas: Unremarkable. No pancreatic ductal dilatation or surrounding inflammatory changes. Spleen: Normal in size without focal abnormality. Adrenals/Urinary Tract: Unremarkable adrenal glands. 2 mm nonobstructing stone again noted in the left kidney. No right sided nephrolithiasis. No hydronephrosis. Bilateral ureters are unremarkable. Urinary bladder within normal limits. Stomach/Bowel: Stomach is within normal limits. Appendix appears normal. Scattered colonic diverticulosis. No evidence of bowel wall thickening, distention, or inflammatory changes. Vascular/Lymphatic: No significant vascular findings are present. No enlarged abdominal or pelvic lymph nodes. Reproductive: Low-density areas at the level of the cervix likely reflecting the both the in cysts. Uterus otherwise within normal limits. The bilateral adnexa are unremarkable. Other: No free fluid. No abdominopelvic fluid collection. No pneumoperitoneum. No abdominal wall hernia. Musculoskeletal: No acute or significant osseous findings. IMPRESSION: 1. No acute abdominopelvic findings. 2. Nonobstructing 2 mm left renal stone. 3. Scattered  colonic diverticulosis without evidence of acute diverticulitis. Electronically Signed   By: Duanne GuessNicholas  Plundo D.O.   On: 03/08/2021 18:01    Procedures Procedures   Medications Ordered in ED Medications  fentaNYL (SUBLIMAZE) injection 50 mcg (50 mcg Intravenous Not Given 03/08/21 1622)  ondansetron (ZOFRAN) injection 4 mg (4 mg Intravenous Not Given 03/08/21 1622)    ED Course  I have reviewed the triage vital signs and the nursing notes.  Pertinent labs & imaging results that were available during my care of the patient were reviewed by me and considered in my medical decision making (see chart for details).    MDM Rules/Calculators/A&P                          50 year old female who presents for evaluation of hematuria, right side abdominal pain that is been ongoing for about a week.  Associate with nausea.  No vomiting.  No fevers.  Went to urgent care and was evaluated told to come to the emergency department for further evaluation of potential appendicitis.  On initial arrival, she is afebrile, toxic appearing.  Vital signs are stable.  On exam, she does have some diffuse tenderness into the right abdomen.  No focal tenderness noted on the McBurney's point.  She states that hematuria has improved.  She does tell me that she had a history of a kidney stone but does not know where it is at.  Low suspicion for appendicitis but she is tender.  Also consider kidney stone versus UTI.  Also consider IBS flare.  Low suspicion for diverticulitis but is also consideration.  We will plan for labs.  Unfortunately, patient is allergic to contrast dye.  We will plan to do imaging without contrast.  UA reviewed from urgent care visit earlier today which showed no signs of infectious etiology.  Lipase normal.  CMP unremarkable.  CBC shows no leukocytosis or anemia.  CT scan shows no acute intra-abdominal finding.  She has a nonobstructing 2 mm kidney stone on the left side.  There is diverticulosis  without any evidence of diverticulitis.  Appendix appears normal.  Discussed results with patient.  This time, patient is hemodynamically stable.  Patient feels comfortable going home.  She has a follow-up appoint with a specialist GI doctor to determine her abdominal pain.  She is getting a test for bacterial overgrowth. At this time, patient exhibits no emergent life-threatening condition that require further evaluation in ED. Patient had ample opportunity for questions and discussion. All patient's questions were answered with full understanding. Strict return precautions discussed. Patient expresses understanding and agreement to plan.   Portions of this note were generated with Scientist, clinical (histocompatibility and immunogenetics). Dictation errors may occur despite best attempts at proofreading.  Final Clinical Impression(s) / ED Diagnoses Final diagnoses:  Abdominal pain, unspecified abdominal location    Rx / DC Orders ED Discharge Orders    None       Rosana Hoes 03/08/21 2036    Tilden Fossa, MD 03/08/21 2243

## 2021-04-03 ENCOUNTER — Telehealth: Payer: Self-pay | Admitting: Cardiology

## 2021-04-03 NOTE — Telephone Encounter (Signed)
Pt c/o BP issue: STAT if pt c/o blurred vision, one-sided weakness or slurred speech  1. What are your last 5 BP readings? Pt does not not  Track BP with a cuff.   2. Are you having any other symptoms (ex. Dizziness, headache, blurred vision, passed out)? Headache that won't seem to go away , eye strain occasional blurry vision  3. What is your BP issue? Patient just feels like her BP is "out of whack". She has never really had issues like this before  STAT if HR is under 50 or over 120 (normal HR is 60-100 beats per minute)  1) What is your heart rate? 90  2) Do you have a log of your heart rate readings (document readings)?   42 this morning, dropped to 36 while patient was on the phone  3) Do you have any other symptoms? See above

## 2021-04-03 NOTE — Telephone Encounter (Signed)
Called and spoke with pt who states that she has been having palpitations all week. Pt states that she has been wearing her apple watch and that it will occasionally drop to 32 for approximately 1 sec and then return to normal. Pt states that she feels as if her BP is elevated but she has not checked it. Pt states that she had chest pain, headache and blurred vision. 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 Advised her to go to Urgent Care or the ED for evaluation as it could be a lot of things. Pt verbalized understanding and had no additional questions.

## 2021-04-03 NOTE — Telephone Encounter (Signed)
I think she needs to go to the emergency room or urgent care ASAP.  I have seen her 2 years ago.

## 2021-09-28 ENCOUNTER — Encounter (HOSPITAL_BASED_OUTPATIENT_CLINIC_OR_DEPARTMENT_OTHER): Payer: Self-pay

## 2021-09-28 ENCOUNTER — Other Ambulatory Visit: Payer: Self-pay

## 2021-09-28 ENCOUNTER — Emergency Department (HOSPITAL_BASED_OUTPATIENT_CLINIC_OR_DEPARTMENT_OTHER)
Admission: EM | Admit: 2021-09-28 | Discharge: 2021-09-28 | Disposition: A | Discharge status: Home / Self Care | Payer: BC Managed Care – PPO | Attending: Student | Admitting: Student

## 2021-09-28 ENCOUNTER — Emergency Department (HOSPITAL_BASED_OUTPATIENT_CLINIC_OR_DEPARTMENT_OTHER): Payer: BC Managed Care – PPO

## 2021-09-28 DIAGNOSIS — K0889 Other specified disorders of teeth and supporting structures: Secondary | ICD-10-CM | POA: Diagnosis present

## 2021-09-28 DIAGNOSIS — M791 Myalgia, unspecified site: Secondary | ICD-10-CM | POA: Diagnosis not present

## 2021-09-28 DIAGNOSIS — Z20822 Contact with and (suspected) exposure to covid-19: Secondary | ICD-10-CM | POA: Insufficient documentation

## 2021-09-28 DIAGNOSIS — J4520 Mild intermittent asthma, uncomplicated: Secondary | ICD-10-CM | POA: Diagnosis not present

## 2021-09-28 LAB — CBC WITH DIFFERENTIAL/PLATELET
Abs Immature Granulocytes: 0.03 10*3/uL (ref 0.00–0.07)
Basophils Absolute: 0 10*3/uL (ref 0.0–0.1)
Basophils Relative: 0 %
Eosinophils Absolute: 0 10*3/uL (ref 0.0–0.5)
Eosinophils Relative: 0 %
HCT: 44.3 % (ref 36.0–46.0)
Hemoglobin: 14.6 g/dL (ref 12.0–15.0)
Immature Granulocytes: 0 %
Lymphocytes Relative: 26 %
Lymphs Abs: 2.3 10*3/uL (ref 0.7–4.0)
MCH: 28.2 pg (ref 26.0–34.0)
MCHC: 33 g/dL (ref 30.0–36.0)
MCV: 85.5 fL (ref 80.0–100.0)
Monocytes Absolute: 0.5 10*3/uL (ref 0.1–1.0)
Monocytes Relative: 5 %
Neutro Abs: 5.8 10*3/uL (ref 1.7–7.7)
Neutrophils Relative %: 69 %
Platelets: 328 10*3/uL (ref 150–400)
RBC: 5.18 MIL/uL — ABNORMAL HIGH (ref 3.87–5.11)
RDW: 12.7 % (ref 11.5–15.5)
WBC: 8.7 10*3/uL (ref 4.0–10.5)
nRBC: 0 % (ref 0.0–0.2)

## 2021-09-28 LAB — COMPREHENSIVE METABOLIC PANEL
ALT: 24 U/L (ref 0–44)
AST: 22 U/L (ref 15–41)
Albumin: 4.3 g/dL (ref 3.5–5.0)
Alkaline Phosphatase: 52 U/L (ref 38–126)
Anion gap: 8 (ref 5–15)
BUN: 15 mg/dL (ref 6–20)
CO2: 25 mmol/L (ref 22–32)
Calcium: 9.3 mg/dL (ref 8.9–10.3)
Chloride: 101 mmol/L (ref 98–111)
Creatinine, Ser: 0.74 mg/dL (ref 0.44–1.00)
GFR, Estimated: >60 mL/min (ref >60)
Glucose, Bld: 96 mg/dL (ref 70–99)
Potassium: 3.6 mmol/L (ref 3.5–5.1)
Sodium: 134 mmol/L — ABNORMAL LOW (ref 135–145)
Total Bilirubin: 0.5 mg/dL (ref 0.3–1.2)
Total Protein: 7.6 g/dL (ref 6.5–8.1)

## 2021-09-28 LAB — RESP PANEL BY RT-PCR (FLU A&B, COVID) ARPGX2
Influenza A by PCR: NEGATIVE
Influenza B by PCR: NEGATIVE
SARS Coronavirus 2 by RT PCR: NEGATIVE

## 2021-09-28 LAB — TROPONIN I (HIGH SENSITIVITY): Troponin I (High Sensitivity): <2 ng/L (ref <18)

## 2021-09-28 MED ORDER — AMOXICILLIN 500 MG PO CAPS
500.0000 mg | ORAL_CAPSULE | Freq: Once | ORAL | Status: AC
  Administered 2021-09-28: 500 mg via ORAL
  Filled 2021-09-28: qty 1

## 2021-09-28 MED ORDER — KETOROLAC TROMETHAMINE 15 MG/ML IJ SOLN
15.0000 mg | Freq: Once | INTRAMUSCULAR | Status: AC
  Administered 2021-09-28: 15 mg via INTRAVENOUS
  Filled 2021-09-28: qty 1

## 2021-09-28 MED ORDER — AMOXICILLIN 500 MG PO CAPS: 500.0000 mg | ORAL_CAPSULE | Freq: Three times a day (TID) | ORAL | 0 refills | Status: DC

## 2021-09-28 NOTE — ED Provider Notes (Signed)
[facility] EMERGENCY DEPARTMENT Provider Note   CSN: [id number] Arrival date & time: 09/28/21  1720     History Chief Complaint  Patient presents with   Dental Pain   Chest Pain    Latoya Schmidt is a 50 y.o. female with PMH anxiety, IBS, asthma who presents emergency department for evaluation of dental pain, right-sided neck pain, bilateral axillary pain who presents emergency department for evaluation of dental and chest pain.  Patient states that for the last 5 days she has had upper maxillary molar pain on the right that radiates down to the neck with intermittent associated bilateral axillary pain.  She endorses a general feeling of chest tightness but denies exertional chest pain, shortness of breath, diaphoresis, nausea, vomiting or any other systemic symptoms.   Dental Pain Associated symptoms: no fever   Chest Pain Associated symptoms: no abdominal pain, no back pain, no cough, no fever, no palpitations, no shortness of breath and no vomiting       Past Medical History:  Diagnosis Date   Anxiety    Asthma    IBS (irritable bowel syndrome)    Ovarian cyst     Patient Active Problem List   Diagnosis Date Noted   Dyspnea on exertion 01/17/2020   Atypical chest pain 01/30/2019   Mixed dyslipidemia 01/30/2019   Elevated BP without diagnosis of hypertension 01/12/2019   Breast mass, right 09/11/2018   Breast pain, left 08/30/2018   Pap smear of cervix shows high risk HPV present 08/22/2017   Current moderate episode of major depressive disorder without prior episode (HCC) 07/29/2017   Low grade squamous intraepithelial lesion (LGSIL) on Papanicolaou smear of cervix 07/29/2017   ADD (attention deficit disorder) 12/18/2015   Asthma 12/18/2015   Generalized anxiety disorder 12/18/2015   IBS (irritable bowel syndrome) 12/18/2015   Mild intermittent asthma without complication 12/18/2015   Mixed hyperlipidemia 12/18/2015    Past Surgical History:   Procedure Laterality Date   CESAREAN SECTION     CHOLECYSTECTOMY     ERCP     ERCP     OVARIAN CYST REMOVAL     TUBAL LIGATION       OB History     Gravida  2   Para  2   Term      Preterm      AB      Living         SAB      IAB      Ectopic      Multiple      Live Births              History reviewed. No pertinent family history.  Social History   Tobacco Use   Smoking status: Never   Smokeless tobacco: Never  Vaping Use   Vaping Use: Never used  Substance Use Topics   Alcohol use: Not Currently   Drug use: Not Currently    Types: Marijuana    Home Medications Prior to Admission medications   Medication Sig Start Date End Date Taking? Authorizing Provider  amoxicillin (AMOXIL) 500 MG capsule Take 1 capsule (500 mg total) by mouth 3 (three) times daily. 09/28/21  Yes Lynisha Osuch, MD  albuterol (PROVENTIL HFA;VENTOLIN HFA) 108 (90 Base) MCG/ACT inhaler Inhale 2 puffs into the lungs every 6 (six) hours as needed for wheezing or shortness of breath.    [provider]  montelukast (SINGULAIR) 10 MG tablet Take 10 mg by mouth daily.  06/04/20   [provider]  Multiple Vitamin (MULITIVITAMIN WITH MINERALS) TABS Take 1 tablet by mouth daily.    [provider]  Omega-3 Fatty Acids (FISH OIL PO) Take by mouth.    [provider]  omeprazole (PRILOSEC) 20 MG capsule Take 20 mg by mouth daily.    [provider]  Triprolidine-Pseudoephedrine (ANTIHISTAMINE DECONGESTANT PO) Take by mouth.    [provider]    Allergies    Iodine-131, Nitrofurantoin macrocrystal, and Ivp dye [iodinated diagnostic agents]  Review of Systems   Review of Systems  Constitutional:  Negative for chills and fever.  HENT:  Positive for dental problem. Negative for ear pain and sore throat.   Eyes:  Negative for pain and visual disturbance.  Respiratory:  Positive for chest tightness. Negative for cough and shortness of  breath.   Cardiovascular:  Positive for chest pain. Negative for palpitations.  Gastrointestinal:  Negative for abdominal pain and vomiting.  Genitourinary:  Negative for dysuria and hematuria.  Musculoskeletal:  Positive for myalgias. Negative for arthralgias and back pain.  Skin:  Negative for color change and rash.  Neurological:  Negative for seizures and syncope.  All other systems reviewed and are negative.  Physical Exam Updated Vital Signs BP 123/87 (BP Location: Right Arm)   Pulse 60   Temp 98 F (36.7 C) (Oral)   Resp 18   Ht 5\' 6"  (1.676 m)   Wt 84.8 kg   SpO2 98%   BMI 30.18 kg/m   Physical Exam Vitals and nursing note reviewed.  Constitutional:      General: She is not in acute distress.    Appearance: She is well-developed.  HENT:     Head: Normocephalic and atraumatic.  Eyes:     Conjunctiva/sclera: Conjunctivae normal.  Cardiovascular:     Rate and Rhythm: Normal rate and regular rhythm.     Heart sounds: No murmur heard. Pulmonary:     Effort: Pulmonary effort is normal. No respiratory distress.     Breath sounds: Normal breath sounds.  Chest:     Chest wall: Tenderness (At the insertion point of the pec muscle to the humerus bilaterally) present.  Abdominal:     Palpations: Abdomen is soft.     Tenderness: There is no abdominal tenderness.  Musculoskeletal:     Cervical back: Neck supple.  Skin:    General: Skin is warm and dry.  Neurological:     Mental Status: She is alert.    ED Results / Procedures / Treatments   Labs (all labs ordered are listed, but only abnormal results are displayed) Labs Reviewed  COMPREHENSIVE METABOLIC PANEL - Abnormal; Notable for the following components:      Result Value   Sodium 134 (*)    All other components within normal limits  CBC WITH DIFFERENTIAL/PLATELET - Abnormal; Notable for the following components:   RBC 5.18 (*)    All other components within normal limits  RESP PANEL BY RT-PCR (FLU A&B,  COVID) ARPGX2  TROPONIN I (HIGH SENSITIVITY)    EKG None  Radiology DG Chest Portable 1 View  Result Date: 09/28/2021 CLINICAL DATA:  Chest pain for several days EXAM: PORTABLE CHEST 1 VIEW COMPARISON:  12/02/2020 FINDINGS: The heart size and mediastinal contours are within normal limits. Both lungs are clear. The visualized skeletal structures are unremarkable. IMPRESSION: No active disease. Electronically Signed   By: 14/06/2020 M.D.   On: 09/28/2021 21:16    Procedures Procedures  Medications Ordered in ED Medications  ketorolac (TORADOL) 15 MG/ML injection 15 mg (15 mg Intravenous Given 09/28/21 2059)  amoxicillin (AMOXIL) capsule 500 mg (500 mg Oral Given 09/28/21 2224)    ED Course  I have reviewed the triage vital signs and the nursing notes.  Pertinent labs & imaging results that were available during my care of the patient were reviewed by me and considered in my medical decision making (see chart for details).    MDM Rules/Calculators/A&P                           Patient seen emergency department for evaluation multiple complaints as described above.  Physical exam reveals multiple dental caries, tenderness at the insertion point of the pectoral muscles bilaterally in the axilla.  No wheezing on pulmonary exam.  Laboratory evaluation unremarkable including high-sensitivity troponin which was negative.  Flu and COVID unremarkable.  Chest x-ray unremarkable.  EG nonischemic.  Patient presentation likely consistent with myalgias from a dental infection or lymphadenitis.  She was started on amoxicillin for her dental infection and she will require follow-up with her dentist of which she has an appointment tomorrow.  She is a heart score of 1 and safe for discharge.  Patient then discharged. Final Clinical Impression(s) / ED Diagnoses Final diagnoses:  Pain, dental  Myalgia    Rx / DC Orders ED Discharge Orders          Ordered    amoxicillin (AMOXIL) 500 MG capsule   3 times daily        09/28/21 2202             Glendora Score, MD 09/28/21 [phone]

## 2021-09-28 NOTE — ED Triage Notes (Signed)
Pt been complaining of upper jaw pain that radiates down neck and into chest x 5 days, also having axillary lymph node pain. States has hx of receding gum lines. Has appt with dentist tomorrow.

## 2022-01-14 ENCOUNTER — Emergency Department (HOSPITAL_BASED_OUTPATIENT_CLINIC_OR_DEPARTMENT_OTHER)
Admission: EM | Admit: 2022-01-14 | Discharge: 2022-01-14 | Disposition: A | Discharge status: Home / Self Care | Payer: BC Managed Care – PPO | Attending: Emergency Medicine | Admitting: Emergency Medicine

## 2022-01-14 ENCOUNTER — Other Ambulatory Visit: Payer: Self-pay

## 2022-01-14 ENCOUNTER — Encounter (HOSPITAL_BASED_OUTPATIENT_CLINIC_OR_DEPARTMENT_OTHER): Payer: Self-pay | Admitting: *Deleted

## 2022-01-14 ENCOUNTER — Emergency Department (HOSPITAL_BASED_OUTPATIENT_CLINIC_OR_DEPARTMENT_OTHER): Payer: BC Managed Care – PPO

## 2022-01-14 DIAGNOSIS — R1013 Epigastric pain: Secondary | ICD-10-CM | POA: Insufficient documentation

## 2022-01-14 DIAGNOSIS — R110 Nausea: Secondary | ICD-10-CM | POA: Diagnosis not present

## 2022-01-14 DIAGNOSIS — R1011 Right upper quadrant pain: Secondary | ICD-10-CM | POA: Diagnosis not present

## 2022-01-14 DIAGNOSIS — R1012 Left upper quadrant pain: Secondary | ICD-10-CM | POA: Insufficient documentation

## 2022-01-14 DIAGNOSIS — R109 Unspecified abdominal pain: Secondary | ICD-10-CM

## 2022-01-14 LAB — COMPREHENSIVE METABOLIC PANEL
ALT: 27 U/L (ref 0–44)
AST: 20 U/L (ref 15–41)
Albumin: 4.2 g/dL (ref 3.5–5.0)
Alkaline Phosphatase: 53 U/L (ref 38–126)
Anion gap: 10 (ref 5–15)
BUN: 17 mg/dL (ref 6–20)
CO2: 24 mmol/L (ref 22–32)
Calcium: 9.2 mg/dL (ref 8.9–10.3)
Chloride: 100 mmol/L (ref 98–111)
Creatinine, Ser: 0.87 mg/dL (ref 0.44–1.00)
GFR, Estimated: >60 mL/min (ref >60)
Glucose, Bld: 96 mg/dL (ref 70–99)
Potassium: 3.9 mmol/L (ref 3.5–5.1)
Sodium: 134 mmol/L — ABNORMAL LOW (ref 135–145)
Total Bilirubin: 0.6 mg/dL (ref 0.3–1.2)
Total Protein: 7.8 g/dL (ref 6.5–8.1)

## 2022-01-14 LAB — CBC WITH DIFFERENTIAL/PLATELET
Abs Immature Granulocytes: 0.02 10*3/uL (ref 0.00–0.07)
Basophils Absolute: 0 10*3/uL (ref 0.0–0.1)
Basophils Relative: 0 %
Eosinophils Absolute: 0 10*3/uL (ref 0.0–0.5)
Eosinophils Relative: 0 %
HCT: 43.9 % (ref 36.0–46.0)
Hemoglobin: 14.7 g/dL (ref 12.0–15.0)
Immature Granulocytes: 0 %
Lymphocytes Relative: 33 %
Lymphs Abs: 2.3 10*3/uL (ref 0.7–4.0)
MCH: 28.4 pg (ref 26.0–34.0)
MCHC: 33.5 g/dL (ref 30.0–36.0)
MCV: 84.9 fL (ref 80.0–100.0)
Monocytes Absolute: 0.4 10*3/uL (ref 0.1–1.0)
Monocytes Relative: 6 %
Neutro Abs: 4.1 10*3/uL (ref 1.7–7.7)
Neutrophils Relative %: 61 %
Platelets: 303 10*3/uL (ref 150–400)
RBC: 5.17 MIL/uL — ABNORMAL HIGH (ref 3.87–5.11)
RDW: 12.3 % (ref 11.5–15.5)
WBC: 7 10*3/uL (ref 4.0–10.5)
nRBC: 0 % (ref 0.0–0.2)

## 2022-01-14 LAB — URINALYSIS, ROUTINE W REFLEX MICROSCOPIC
Bilirubin Urine: NEGATIVE
Glucose, UA: NEGATIVE mg/dL
Hgb urine dipstick: NEGATIVE
Ketones, ur: NEGATIVE mg/dL
Leukocytes,Ua: NEGATIVE
Nitrite: NEGATIVE
Protein, ur: NEGATIVE mg/dL
Specific Gravity, Urine: >1.03 — ABNORMAL HIGH (ref 1.005–1.030)
pH: 5.5 (ref 5.0–8.0)

## 2022-01-14 LAB — LIPASE, BLOOD: Lipase: 31 U/L (ref 11–51)

## 2022-01-14 LAB — PREGNANCY, URINE: Preg Test, Ur: NEGATIVE

## 2022-01-14 MED ORDER — MORPHINE SULFATE (PF) 4 MG/ML IV SOLN
4.0000 mg | Freq: Once | INTRAVENOUS | Status: AC
  Administered 2022-01-14: 4 mg via INTRAVENOUS
  Filled 2022-01-14: qty 1

## 2022-01-14 MED ORDER — SODIUM CHLORIDE 0.9 % IV BOLUS
1000.0000 mL | Freq: Once | INTRAVENOUS | Status: AC
  Administered 2022-01-14: 1000 mL via INTRAVENOUS

## 2022-01-14 NOTE — Discharge Instructions (Signed)
If you develop worsening, continued, or recurrent abdominal pain, uncontrolled vomiting, fever, chest or back pain, or any other new/concerning symptoms then return to the ER for evaluation.  

## 2022-01-14 NOTE — ED Triage Notes (Signed)
Right flank pain constant for a week. Pain goes into her right mid abdomen.

## 2022-01-14 NOTE — ED Provider Notes (Signed)
[facility] EMERGENCY DEPARTMENT Provider Note   CSN: [id number] Arrival date & time: 01/14/22  1747     History  Chief Complaint  Patient presents with   Flank Pain    Trinyti Iredale is a 51 y.o. female.  HPI 51 year old female presents with abdominal pain.  History is by her and husband at the bedside.  Has been having pain for at least 1 week but probably more like 2 according to the husband.  He has been getting worse and was primarily right flank and right upper quadrant but now is also in the left upper abdomen.  Some nausea but no vomiting.  Her stool was white at 1 point but now it is normal.  No fevers or urinary symptoms besides dark urine.  She has tried ibuprofen and her chronic omeprazole without relief. Pain is 7/10. She has had her gallbladder removed.  Home Medications Prior to Admission medications   Medication Sig Start Date End Date Taking? Authorizing Provider  omeprazole (PRILOSEC) 20 MG capsule Take 20 mg by mouth daily.   Yes [provider]  albuterol (PROVENTIL HFA;VENTOLIN HFA) 108 (90 Base) MCG/ACT inhaler Inhale 2 puffs into the lungs every 6 (six) hours as needed for wheezing or shortness of breath.    [provider]  amoxicillin (AMOXIL) 500 MG capsule Take 1 capsule (500 mg total) by mouth 3 (three) times daily. 09/28/21   Kommor, Madison, MD  montelukast (SINGULAIR) 10 MG tablet Take 10 mg by mouth daily. 06/04/20   [provider]  Multiple Vitamin (MULITIVITAMIN WITH MINERALS) TABS Take 1 tablet by mouth daily.    [provider]  Omega-3 Fatty Acids (FISH OIL PO) Take by mouth.    [provider]  Triprolidine-Pseudoephedrine (ANTIHISTAMINE DECONGESTANT PO) Take by mouth.    [provider]      Allergies    Iodine-131, Nitrofurantoin macrocrystal, and Ivp dye [iodinated contrast media]    Review of Systems   Review of Systems  Constitutional:  Negative for fever.   Gastrointestinal:  Positive for abdominal pain and nausea. Negative for blood in stool and vomiting.  Genitourinary:  Positive for flank pain. Negative for dysuria and hematuria.   Physical Exam Updated Vital Signs BP 119/75 (BP Location: Right Arm)    Pulse 60    Temp 97.8 F (36.6 C) (Oral)    Resp 18    Ht 5\' 6"  (1.676 m)    Wt 84.8 kg    SpO2 98%    BMI 30.17 kg/m  Physical Exam Vitals and nursing note reviewed.  Constitutional:      Appearance: She is well-developed.  HENT:     Head: Normocephalic and atraumatic.  Cardiovascular:     Rate and Rhythm: Normal rate and regular rhythm.     Heart sounds: Normal heart sounds.  Pulmonary:     Effort: Pulmonary effort is normal.     Breath sounds: Normal breath sounds.  Abdominal:     Palpations: Abdomen is soft.     Tenderness: There is abdominal tenderness in the right upper quadrant, epigastric area and left upper quadrant. Negative signs include Murphy's sign.     Comments: Worst in RUQ  Skin:    General: Skin is warm and dry.  Neurological:     Mental Status: She is alert.    ED Results / Procedures / Treatments   Labs (all labs ordered are listed, but only abnormal results are displayed) Labs Reviewed  URINALYSIS, ROUTINE  W REFLEX MICROSCOPIC - Abnormal; Notable for the following components:      Result Value   Specific Gravity, Urine >1.030 (*)    All other components within normal limits  COMPREHENSIVE METABOLIC PANEL - Abnormal; Notable for the following components:   Sodium 134 (*)    All other components within normal limits  CBC WITH DIFFERENTIAL/PLATELET - Abnormal; Notable for the following components:   RBC 5.17 (*)    All other components within normal limits  LIPASE, BLOOD  PREGNANCY, URINE    EKG None  Radiology CT ABDOMEN PELVIS WO CONTRAST  Result Date: 01/14/2022 CLINICAL DATA:  Right flank pain for 1 week. EXAM: CT ABDOMEN AND PELVIS WITHOUT CONTRAST TECHNIQUE: Multidetector CT imaging of the  abdomen and pelvis was performed following the standard protocol without IV contrast. RADIATION DOSE REDUCTION: This exam was performed according to the departmental dose-optimization program which includes automated exposure control, adjustment of the mA and/or kV according to patient size and/or use of iterative reconstruction technique. COMPARISON:  03/08/2021 FINDINGS: Lower chest: Streaky basilar scarring changes. No infiltrates or effusions. No pulmonary lesions. The heart is normal in size. No pericardial effusion. Hepatobiliary: No hepatic lesions or intrahepatic biliary dilatation. The gallbladder is surgically absent. No common bile duct dilatation. Pancreas: No mass, inflammation or ductal dilatation. Spleen: Normal size.  No focal lesions. Adrenals/Urinary Tract: Adrenal glands normal. Small midpole left renal calculus. No right-sided renal calculi and no obstructing ureteral calculi or hydroureteronephrosis. Tiny calcification near the distal right ureter is unchanged since prior study. No bladder mass or bladder calculi. Stomach/Bowel: Stomach, duodenum, small bowel and colon. No acute inflammatory changes, mass lesions or obstructive findings. The terminal ileum is normal. The appendix is normal. Stable diffuse colonic diverticulosis without findings for acute diverticulitis. Vascular/Lymphatic: The aorta is normal in caliber. No atheroscerlotic calcifications. No mesenteric of retroperitoneal mass or adenopathy. Small scattered lymph nodes are noted. Reproductive: The uterus and ovaries are unremarkable. Other: No pelvic mass or adenopathy. No free pelvic fluid collections. No inguinal mass or adenopathy. No abdominal wall hernia or subcutaneous lesions. Musculoskeletal: No significant bony findings. IMPRESSION: 1. Small midpole left renal calculus but no obstructing ureteral calculi or bladder calculi. 2. No acute abdominal/pelvic findings, mass lesions or adenopathy. 3. Status post cholecystectomy.  No biliary dilatation. 4. Stable diffuse colonic diverticulosis without findings for acute diverticulitis. Electronically Signed   By: Marijo Sanes M.D.   On: 01/14/2022 20:44    Procedures Procedures    Medications Ordered in ED Medications  sodium chloride 0.9 % bolus 1,000 mL ( Intravenous Stopped 01/14/22 2112)  morphine 4 MG/ML injection 4 mg (4 mg Intravenous Given 01/14/22 2000)    ED Course/ Medical Decision Making/ A&P                            Patient appears to have abdominal pain/flank pain of unclear etiology.  I have reviewed and interpreted her labs which are pretty unremarkable including no leukocytosis, no UTI, or significant LFT dysfunction/normal lipase.  A CT scan was obtained given her degree of discomfort and I have personally reviewed and interpreted these images and see no obvious emergent conditions.  Agree with radiology.  I have discussed with her and her husband at the bedside who helps provide history.  Further chart review shows that she has been to urgent care and our ER before and follows up with gastroenterology all for abdominal pain.  This might  be functional.  She was given a dose of IV morphine for her pain which did improve her symptoms.  However at this point, I do not think further work-up or admission is needed and I think she is stable for discharge home.  We will give return precautions.        Final Clinical Impression(s) / ED Diagnoses Final diagnoses:  Nonspecific abdominal pain    Rx / DC Orders ED Discharge Orders     None         Sherwood Gambler, MD 01/14/22 2254

## 2022-04-20 ENCOUNTER — Emergency Department (HOSPITAL_BASED_OUTPATIENT_CLINIC_OR_DEPARTMENT_OTHER)
Admission: EM | Admit: 2022-04-20 | Discharge: 2022-04-20 | Disposition: A | Discharge status: Home / Self Care | Payer: BC Managed Care – PPO | Attending: Emergency Medicine | Admitting: Emergency Medicine

## 2022-04-20 ENCOUNTER — Other Ambulatory Visit: Payer: Self-pay

## 2022-04-20 ENCOUNTER — Emergency Department (HOSPITAL_BASED_OUTPATIENT_CLINIC_OR_DEPARTMENT_OTHER): Payer: BC Managed Care – PPO

## 2022-04-20 ENCOUNTER — Encounter (HOSPITAL_BASED_OUTPATIENT_CLINIC_OR_DEPARTMENT_OTHER): Payer: Self-pay | Admitting: *Deleted

## 2022-04-20 DIAGNOSIS — J45909 Unspecified asthma, uncomplicated: Secondary | ICD-10-CM | POA: Insufficient documentation

## 2022-04-20 DIAGNOSIS — R1084 Generalized abdominal pain: Secondary | ICD-10-CM | POA: Diagnosis not present

## 2022-04-20 DIAGNOSIS — R109 Unspecified abdominal pain: Secondary | ICD-10-CM | POA: Diagnosis present

## 2022-04-20 LAB — CBC WITH DIFFERENTIAL/PLATELET
Abs Immature Granulocytes: 0.02 10*3/uL (ref 0.00–0.07)
Basophils Absolute: 0 10*3/uL (ref 0.0–0.1)
Basophils Relative: 0 %
Eosinophils Absolute: 0 10*3/uL (ref 0.0–0.5)
Eosinophils Relative: 0 %
HCT: 44.3 % (ref 36.0–46.0)
Hemoglobin: 14.8 g/dL (ref 12.0–15.0)
Immature Granulocytes: 0 %
Lymphocytes Relative: 21 %
Lymphs Abs: 1.5 10*3/uL (ref 0.7–4.0)
MCH: 28.3 pg (ref 26.0–34.0)
MCHC: 33.4 g/dL (ref 30.0–36.0)
MCV: 84.7 fL (ref 80.0–100.0)
Monocytes Absolute: 0.4 10*3/uL (ref 0.1–1.0)
Monocytes Relative: 6 %
Neutro Abs: 5 10*3/uL (ref 1.7–7.7)
Neutrophils Relative %: 73 %
Platelets: 279 10*3/uL (ref 150–400)
RBC: 5.23 MIL/uL — ABNORMAL HIGH (ref 3.87–5.11)
RDW: 12.6 % (ref 11.5–15.5)
WBC: 6.9 10*3/uL (ref 4.0–10.5)
nRBC: 0 % (ref 0.0–0.2)

## 2022-04-20 LAB — COMPREHENSIVE METABOLIC PANEL
ALT: 22 U/L (ref 0–44)
AST: 19 U/L (ref 15–41)
Albumin: 4 g/dL (ref 3.5–5.0)
Alkaline Phosphatase: 50 U/L (ref 38–126)
Anion gap: 5 (ref 5–15)
BUN: 15 mg/dL (ref 6–20)
CO2: 25 mmol/L (ref 22–32)
Calcium: 9 mg/dL (ref 8.9–10.3)
Chloride: 107 mmol/L (ref 98–111)
Creatinine, Ser: 0.83 mg/dL (ref 0.44–1.00)
GFR, Estimated: >60 mL/min (ref >60)
Glucose, Bld: 101 mg/dL — ABNORMAL HIGH (ref 70–99)
Potassium: 3.6 mmol/L (ref 3.5–5.1)
Sodium: 137 mmol/L (ref 135–145)
Total Bilirubin: 0.8 mg/dL (ref 0.3–1.2)
Total Protein: 7.8 g/dL (ref 6.5–8.1)

## 2022-04-20 LAB — URINALYSIS, ROUTINE W REFLEX MICROSCOPIC
Bilirubin Urine: NEGATIVE
Glucose, UA: NEGATIVE mg/dL
Hgb urine dipstick: NEGATIVE
Ketones, ur: 15 mg/dL — AB
Leukocytes,Ua: NEGATIVE
Nitrite: NEGATIVE
Protein, ur: NEGATIVE mg/dL
Specific Gravity, Urine: >=1.03 (ref 1.005–1.030)
pH: 5.5 (ref 5.0–8.0)

## 2022-04-20 LAB — LIPASE, BLOOD: Lipase: 30 U/L (ref 11–51)

## 2022-04-20 MED ORDER — ONDANSETRON 4 MG PO TBDP: 4.0000 mg | ORAL_TABLET | Freq: Three times a day (TID) | ORAL | 0 refills | Status: DC | PRN

## 2022-04-20 MED ORDER — SODIUM CHLORIDE 0.9 % IV BOLUS
1000.0000 mL | Freq: Once | INTRAVENOUS | Status: AC
  Administered 2022-04-20: 1000 mL via INTRAVENOUS

## 2022-04-20 MED ORDER — ONDANSETRON HCL 4 MG/2ML IJ SOLN
4.0000 mg | Freq: Once | INTRAMUSCULAR | Status: AC
  Administered 2022-04-20: 4 mg via INTRAVENOUS
  Filled 2022-04-20: qty 2

## 2022-04-20 MED ORDER — DICYCLOMINE HCL 20 MG PO TABS: 20.0000 mg | ORAL_TABLET | Freq: Two times a day (BID) | ORAL | 0 refills | Status: DC

## 2022-04-20 MED ORDER — MORPHINE SULFATE (PF) 4 MG/ML IV SOLN
4.0000 mg | Freq: Once | INTRAVENOUS | Status: AC
  Administered 2022-04-20: 4 mg via INTRAVENOUS
  Filled 2022-04-20: qty 1

## 2022-04-20 NOTE — ED Notes (Signed)
Pt does not want pain or nausea meds at this time. Pt stated she will take the fluid and would like for all blood and radiology results to come back first.  ?

## 2022-04-20 NOTE — ED Notes (Signed)
Dc instructions and scripts reviewed with pt. No questions or concerns at this time. Will follow up with gi  ? ?

## 2022-04-20 NOTE — ED Provider Notes (Signed)
?[facility] EMERGENCY DEPARTMENT ?Provider Note ? ? ?CSN: [id number] ?Arrival date & time: 04/20/22  1028 ? ?  ? ?History ? ?Chief Complaint  ?Patient presents with  ? Abdominal Pain  ? ? ?Latoya Schmidt is a 51 y.o. female. ? ?51 year old female presents today for evaluation of abdominal pain following colonoscopy and endoscopy which were done yesterday.  She states the clinic had called to check on her and she told them she was having diffuse abdominal pain and they encouraged her to come to the emergency room for evaluation.  She had some abdominal pain prior to the procedure however states this pain is diffuse and worse.  Denies nausea vomiting, but states she overall does not feel well.  She states immediately after the procedure she felt well and today is when her symptoms started. ? ?The history is provided by the patient. No language interpreter was used.  ? ?  ? ?Home Medications ?Prior to Admission medications   ?Medication Sig Start Date End Date Taking? Authorizing Provider  ?albuterol (PROVENTIL HFA;VENTOLIN HFA) 108 (90 Base) MCG/ACT inhaler Inhale 2 puffs into the lungs every 6 (six) hours as needed for wheezing or shortness of breath.    [provider]  ?amoxicillin (AMOXIL) 500 MG capsule Take 1 capsule (500 mg total) by mouth 3 (three) times daily. 09/28/21   Kommor, Madison, MD  ?montelukast (SINGULAIR) 10 MG tablet Take 10 mg by mouth daily. 06/04/20   [provider]  ?Multiple Vitamin (MULITIVITAMIN WITH MINERALS) TABS Take 1 tablet by mouth daily.    [provider]  ?Omega-3 Fatty Acids (FISH OIL PO) Take by mouth.    [provider]  ?omeprazole (PRILOSEC) 20 MG capsule Take 20 mg by mouth daily.    [provider]  ?Triprolidine-Pseudoephedrine (ANTIHISTAMINE DECONGESTANT PO) Take by mouth.    [provider]  ?   ? ?Allergies    ?Iodine-131, Nitrofurantoin macrocrystal, and Ivp dye [iodinated contrast media]   ? ?Review of  Systems   ?Review of Systems  ?Constitutional:  Negative for chills and fever.  ?Respiratory:  Negative for shortness of breath.   ?Cardiovascular:  Negative for chest pain.  ?Gastrointestinal:  Positive for abdominal pain. Negative for nausea and vomiting.  ?Genitourinary:  Negative for difficulty urinating.  ?Neurological:  Negative for weakness and light-headedness.  ?All other systems reviewed and are negative. ? ?Physical Exam ?Updated Vital Signs ?BP 128/88 (BP Location: Left Arm)   Pulse 80   Temp 98.1 ?F (36.7 ?C) (Oral)   Resp 16   Ht 5\' 6"  (1.676 m)   Wt 85 kg   SpO2 98%   BMI 30.25 kg/m?  ?Physical Exam ?Vitals and nursing note reviewed.  ?Constitutional:   ?   General: She is not in acute distress. ?   Appearance: Normal appearance. She is not ill-appearing.  ?HENT:  ?   Head: Normocephalic and atraumatic.  ?   Nose: Nose normal.  ?Eyes:  ?   General: No scleral icterus. ?   Extraocular Movements: Extraocular movements intact.  ?   Conjunctiva/sclera: Conjunctivae normal.  ?Cardiovascular:  ?   Rate and Rhythm: Normal rate and regular rhythm.  ?   Pulses: Normal pulses.  ?   Heart sounds: Normal heart sounds.  ?Pulmonary:  ?   Effort: Pulmonary effort is normal. No respiratory distress.  ?   Breath sounds: Normal breath sounds. No wheezing or rales.  ?Abdominal:  ?   General: There is no distension.  ?  Tenderness: There is abdominal tenderness. There is no right CVA tenderness, left CVA tenderness or guarding.  ?Musculoskeletal:     ?   General: Normal range of motion.  ?   Cervical back: Normal range of motion.  ?Skin: ?   General: Skin is warm and dry.  ?Neurological:  ?   General: No focal deficit present.  ?   Mental Status: She is alert. Mental status is at baseline.  ? ? ?ED Results / Procedures / Treatments   ?Labs ?(all labs ordered are listed, but only abnormal results are displayed) ?Labs Reviewed  ?CBC WITH DIFFERENTIAL/PLATELET  ?COMPREHENSIVE METABOLIC PANEL  ?LIPASE, BLOOD   ?URINALYSIS, ROUTINE W REFLEX MICROSCOPIC  ? ? ?EKG ?None ? ?Radiology ?No results found. ? ?Procedures ?Procedures  ? ? ?Medications Ordered in ED ?Medications  ?sodium chloride 0.9 % bolus 1,000 mL (has no administration in time range)  ?morphine (PF) 4 MG/ML injection 4 mg (has no administration in time range)  ?ondansetron (ZOFRAN) injection 4 mg (has no administration in time range)  ? ? ?ED Course/ Medical Decision Making/ A&P ?Clinical Course as of 04/20/22 1352  ?Tue Apr 20, 2022  ?1253 Plain films and CT scan without evidence of perforation or other acute intra-abdominal findings.  CBC without leukocytosis or anemia.  CMP unremarkable.  Lipase within normal limits.  Patient initially refused pain medication but will now accept.  Will reevaluate after pain medication. [AA]  ?1348 Ketones, ur(!): 15 [AA]  ?  ?Clinical Course User Index ?Verne Grain, PA-C  ? ?                        ?Medical Decision Making ?Amount and/or Complexity of Data Reviewed ?Labs: ordered. Decision-making details documented in ED Course. ?Radiology: ordered. ? ?Risk ?Prescription drug management. ? ? ?Medical Decision Making / ED Course ? ? ?This patient presents to the ED for concern of abdominal pain following GI procedure yesterday, this involves an extensive number of treatment options, and is a complaint that carries with it a high risk of complications and morbidity.  The differential diagnosis includes bowel perforation, appendicitis, cholecystitis, pancreatitis, UTI, gastroenteritis ? ?MDM: ?51 year old female presents with abdominal pain following colonoscopy and endoscopy which were done yesterday.  Patient reports feeling well right after the procedure however this morning reports diffuse and severe abdominal pain.  Denies associated nausea and vomiting.  She also reports some back pain.  Abdomen nondistended but diffusely tender on exam without guarding.  She states she has a history of hiatal hernia.  Multiple  biopsies were taken yesterday.  Will obtain blood work, UA, provide pain relief and IV hydration and obtain plain films to assess for free air.  Patient is severely allergic to contrast and would like to avoid it if at all possible. ?Patient following pain medication with complete resolution of her abdominal pain.  UA without signs of UTI.  Patient is appropriate for discharge to follow-up with her gastroenterologist as needed.  We will provide patient with Bentyl and Zofran to keep on hand for symptomatic management.  Discussed use of Tylenol for pain relief as well.  Return precautions discussed.  Patient voices understanding and is in agreement with plan. ? ? ?Additional history obtained: ?-Additional history obtained from gastroenterology visit confirming patient right upper quadrant abdominal pain for which she underwent work-up. ?-External records from outside source obtained and reviewed including: Chart review including previous notes, labs, imaging, consultation notes ? ? ?Lab  Tests: ?-I ordered, reviewed, and interpreted labs.   ?The pertinent results include:   ?Labs Reviewed  ?CBC WITH DIFFERENTIAL/PLATELET  ?COMPREHENSIVE METABOLIC PANEL  ?LIPASE, BLOOD  ?URINALYSIS, ROUTINE W REFLEX MICROSCOPIC  ?  ? ? ?EKG ? EKG Interpretation ? ?Date/Time:    ?Ventricular Rate:    ?PR Interval:    ?QRS Duration:   ?QT Interval:    ?QTC Calculation:   ?R Axis:     ?Text Interpretation:   ?  ? ?  ? ? ? ?Imaging Studies ordered: ?I ordered imaging studies including CT abdomen pelvis with contrast, free air plain radiograph series ?I independently visualized and interpreted imaging. ?I agree with the radiologist interpretation ? ? ?Medicines ordered and prescription drug management: ?Meds ordered this encounter  ?Medications  ? sodium chloride 0.9 % bolus 1,000 mL  ? morphine (PF) 4 MG/ML injection 4 mg  ? ondansetron (ZOFRAN) injection 4 mg  ?  ?-I have reviewed the patients home medicines and have made adjustments as  needed ? ?Reevaluation: ?After the interventions noted above, I reevaluated the patient and found that they have :resolved ? ?Co morbidities that complicate the patient evaluation ? ?Past Medical History:  ?Diagnosis Date  ? A

## 2022-04-20 NOTE — ED Triage Notes (Signed)
Yesterday had colonoscopy and endoscopy and having a lot of pain, called MD and was told to go to the ED.  ?

## 2022-04-20 NOTE — Discharge Instructions (Addendum)
Your work-up today was reassuring.  There is no evidence of bowel perforation.  After IV medications in the emergency room you had resolution of your pain.  Continue drinking plenty of fluids.  I have sent Zofran and Bentyl to the pharmacy for you to use as needed for abdominal pain and nausea.  You can also take Tylenol.  If you have any worsening symptoms please return to the emergency room otherwise follow-up with your gastroenterologist. ?

## 2022-06-02 ENCOUNTER — Encounter (HOSPITAL_BASED_OUTPATIENT_CLINIC_OR_DEPARTMENT_OTHER): Payer: Self-pay | Admitting: Emergency Medicine

## 2022-06-02 ENCOUNTER — Emergency Department (HOSPITAL_BASED_OUTPATIENT_CLINIC_OR_DEPARTMENT_OTHER)
Admission: EM | Admit: 2022-06-02 | Discharge: 2022-06-02 | Disposition: A | Discharge status: Home / Self Care | Payer: BC Managed Care – PPO | Attending: Emergency Medicine | Admitting: Emergency Medicine

## 2022-06-02 ENCOUNTER — Other Ambulatory Visit: Payer: Self-pay

## 2022-06-02 ENCOUNTER — Emergency Department (HOSPITAL_BASED_OUTPATIENT_CLINIC_OR_DEPARTMENT_OTHER): Payer: BC Managed Care – PPO

## 2022-06-02 DIAGNOSIS — F0781 Postconcussional syndrome: Secondary | ICD-10-CM | POA: Diagnosis not present

## 2022-06-02 DIAGNOSIS — R519 Headache, unspecified: Secondary | ICD-10-CM | POA: Diagnosis present

## 2022-06-02 MED ORDER — PROCHLORPERAZINE EDISYLATE 10 MG/2ML IJ SOLN
10.0000 mg | Freq: Once | INTRAMUSCULAR | Status: AC
  Administered 2022-06-02: 10 mg via INTRAVENOUS
  Filled 2022-06-02: qty 2

## 2022-06-02 MED ORDER — DIPHENHYDRAMINE HCL 50 MG/ML IJ SOLN
25.0000 mg | Freq: Once | INTRAMUSCULAR | Status: AC
  Administered 2022-06-02: 25 mg via INTRAVENOUS
  Filled 2022-06-02: qty 1

## 2022-06-02 NOTE — ED Notes (Signed)
Written and verbal inst to pt  Verbalized an understanding  To home  
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
## 2022-06-02 NOTE — ED Notes (Signed)
Patient transported to CT 

## 2022-06-02 NOTE — ED Triage Notes (Signed)
Pt reports RT posterior HA x 1 wk; was referred here from UC for further eval

## 2022-06-02 NOTE — Discharge Instructions (Addendum)
We saw you in the ER for headaches. All the labs and imaging are normal. It is possible that your headaches could be because of postconcussion syndrome.  Please read the instructions provided.  Consider following up with your primary care doctor or neurologist if the symptoms continue.  Please take ibuprofen every 6 hours for the next 48 hours, and take other meds prescribed only for break through pain.

## 2022-06-02 NOTE — ED Notes (Signed)
Discussed pt w/ EDP; he does not want to order CT at this time; want to evaluate pt first

## 2022-09-29 ENCOUNTER — Emergency Department (HOSPITAL_COMMUNITY)
Admission: EM | Admit: 2022-09-29 | Discharge: 2022-09-30 | Disposition: A | Discharge status: Home / Self Care | Payer: BC Managed Care – PPO | Attending: Emergency Medicine | Admitting: Emergency Medicine

## 2022-09-29 ENCOUNTER — Encounter (HOSPITAL_COMMUNITY): Payer: Self-pay | Admitting: Emergency Medicine

## 2022-09-29 ENCOUNTER — Emergency Department (HOSPITAL_COMMUNITY): Payer: BC Managed Care – PPO

## 2022-09-29 DIAGNOSIS — R110 Nausea: Secondary | ICD-10-CM | POA: Diagnosis not present

## 2022-09-29 DIAGNOSIS — R1011 Right upper quadrant pain: Secondary | ICD-10-CM | POA: Diagnosis present

## 2022-09-29 DIAGNOSIS — R1084 Generalized abdominal pain: Secondary | ICD-10-CM | POA: Insufficient documentation

## 2022-09-29 LAB — CBC
HCT: 45.8 % (ref 36.0–46.0)
Hemoglobin: 15.3 g/dL — ABNORMAL HIGH (ref 12.0–15.0)
MCH: 28.5 pg (ref 26.0–34.0)
MCHC: 33.4 g/dL (ref 30.0–36.0)
MCV: 85.4 fL (ref 80.0–100.0)
Platelets: 333 10*3/uL (ref 150–400)
RBC: 5.36 MIL/uL — ABNORMAL HIGH (ref 3.87–5.11)
RDW: 12.5 % (ref 11.5–15.5)
WBC: 6.2 10*3/uL (ref 4.0–10.5)
nRBC: 0 % (ref 0.0–0.2)

## 2022-09-29 LAB — URINALYSIS, ROUTINE W REFLEX MICROSCOPIC
Bilirubin Urine: NEGATIVE
Glucose, UA: NEGATIVE mg/dL
Hgb urine dipstick: NEGATIVE
Ketones, ur: NEGATIVE mg/dL
Leukocytes,Ua: NEGATIVE
Nitrite: NEGATIVE
Protein, ur: NEGATIVE mg/dL
Specific Gravity, Urine: 1.025 (ref 1.005–1.030)
pH: 5 (ref 5.0–8.0)

## 2022-09-29 LAB — I-STAT BETA HCG BLOOD, ED (MC, WL, AP ONLY): I-stat hCG, quantitative: <5 m[IU]/mL (ref <5)

## 2022-09-29 MED ORDER — KETOROLAC TROMETHAMINE 30 MG/ML IJ SOLN
30.0000 mg | Freq: Once | INTRAMUSCULAR | Status: DC
  Filled 2022-09-29: qty 1

## 2022-09-29 MED ORDER — ACETAMINOPHEN 500 MG PO TABS
1000.0000 mg | ORAL_TABLET | Freq: Four times a day (QID) | ORAL | Status: DC | PRN
  Administered 2022-09-29: 1000 mg via ORAL
  Filled 2022-09-29: qty 2

## 2022-09-29 NOTE — ED Triage Notes (Signed)
Patient with history of pancreatic insufficiency here with complaint of RUQ abdominal pain that started last night. Patient is alert, oriented, and in no apparent distress at this time.

## 2022-09-29 NOTE — ED Provider Triage Note (Signed)
Emergency Medicine Provider Triage Evaluation Note  Raena Pau , a 51 y.o. female  was evaluated in triage.  Pt complains of right upper quadrant abdominal pain.  Pain started yesterday.  Has progressively gotten worse.  Now has nausea.  Recently diagnosed with pancreatic insufficiency and started on Creon.  Review of Systems  Positive: As above Negative: Fevers, chills, vomiting, diarrhea  Physical Exam  BP (!) 147/96 (BP Location: Right Arm)   Pulse 94   Temp 98.3 F (36.8 C) (Oral)   Resp 16   SpO2 97%  Gen:   Awake, no distress   Resp:  Normal effort  MSK:   Moves extremities without difficulty  Other:  Abdomen mildly and diffusely tender  Medical Decision Making  Medically screening exam initiated at 12:00 PM.  Appropriate orders placed.  Latica Hohmann was informed that the remainder of the evaluation will be completed by another provider, this initial triage assessment does not replace that evaluation, and the importance of remaining in the ED until their evaluation is complete.     Roylene Reason, [place] 09/29/22 1201

## 2022-09-29 NOTE — ED Provider Notes (Signed)
[facility] EMERGENCY DEPARTMENT Provider Note   CSN: [id number] Arrival date & time: 09/29/22  1056     History  Chief Complaint  Patient presents with   Abdominal Pain   HPI Latoya Schmidt is a 51 y.o. female with pancreatic insufficiency, status post cholecystectomy doing for abdominal pain. Pain is located in her right upper quadrant and radiates to the right side of her back.  She is also endorsing lower abdominal pain. Pain is all day long and feels achy.  She is also endorsing associated nausea.  Also states that she is has renal stones "that been there for a while". Denies fever.  Also mentioned malodorous vaginal discharge that is been going on for months.  States that she has not seen her OB/Gyn in at least a year and a half.   Abdominal Pain      Home Medications Prior to Admission medications   Medication Sig Start Date End Date Taking? Authorizing Provider  albuterol (PROVENTIL HFA;VENTOLIN HFA) 108 (90 Base) MCG/ACT inhaler Inhale 2 puffs into the lungs every 6 (six) hours as needed for wheezing or shortness of breath.    [provider]  amoxicillin (AMOXIL) 500 MG capsule Take 1 capsule (500 mg total) by mouth 3 (three) times daily. 09/28/21   Kommor, Madison, MD  dicyclomine (BENTYL) 20 MG tablet Take 1 tablet (20 mg total) by mouth 2 (two) times daily. 04/20/22   Deatra Canter, Amjad, PA-C  montelukast (SINGULAIR) 10 MG tablet Take 10 mg by mouth daily. 06/04/20   [provider]  Multiple Vitamin (MULITIVITAMIN WITH MINERALS) TABS Take 1 tablet by mouth daily.    [provider]  Omega-3 Fatty Acids (FISH OIL PO) Take by mouth.    [provider]  omeprazole (PRILOSEC) 20 MG capsule Take 20 mg by mouth daily.    [provider]  ondansetron (ZOFRAN-ODT) 4 MG disintegrating tablet Take 1 tablet (4 mg total) by mouth every 8 (eight) hours as needed for nausea or vomiting. 04/20/22   Deatra Canter, Amjad, PA-C   Triprolidine-Pseudoephedrine (ANTIHISTAMINE DECONGESTANT PO) Take by mouth.    [provider]      Allergies    Iodine-131, Nitrofurantoin macrocrystal, and Ivp dye [iodinated contrast media]    Review of Systems   Review of Systems  Gastrointestinal:  Positive for abdominal pain.    Physical Exam Updated Vital Signs BP (!) 149/96 (BP Location: Right Arm)   Pulse 72   Temp 97.7 F (36.5 C) (Oral)   Resp 14   SpO2 100%  Physical Exam Vitals and nursing note reviewed.  HENT:     Head: Normocephalic and atraumatic.     Mouth/Throat:     Mouth: Mucous membranes are moist.  Eyes:     General:        Right eye: No discharge.        Left eye: No discharge.     Conjunctiva/sclera: Conjunctivae normal.  Cardiovascular:     Rate and Rhythm: Normal rate and regular rhythm.     Pulses: Normal pulses.     Heart sounds: Normal heart sounds.  Pulmonary:     Effort: Pulmonary effort is normal.     Breath sounds: Normal breath sounds.  Abdominal:     General: Abdomen is flat.     Palpations: Abdomen is soft.     Tenderness: There is abdominal tenderness in the right upper quadrant, right lower quadrant and left lower quadrant. There is no right CVA  tenderness or left CVA tenderness.  Skin:    General: Skin is warm and dry.  Neurological:     General: No focal deficit present.  Psychiatric:        Mood and Affect: Mood normal.     ED Results / Procedures / Treatments   Labs (all labs ordered are listed, but only abnormal results are displayed) Labs Reviewed  CBC - Abnormal; Notable for the following components:      Result Value   RBC 5.36 (*)    Hemoglobin 15.3 (*)    All other components within normal limits  URINALYSIS, ROUTINE W REFLEX MICROSCOPIC  COMPREHENSIVE METABOLIC PANEL  LIPASE, BLOOD  I-STAT BETA HCG BLOOD, ED ([facility], [facility], [facility] ONLY)    EKG None  Radiology CT Abdomen Pelvis Wo Contrast  Result Date: 09/29/2022 CLINICAL DATA:  Abdominal pain  EXAM: CT ABDOMEN AND PELVIS WITHOUT CONTRAST TECHNIQUE: Multidetector CT imaging of the abdomen and pelvis was performed following the standard protocol without IV contrast. RADIATION DOSE REDUCTION: This exam was performed according to the departmental dose-optimization program which includes automated exposure control, adjustment of the mA and/or kV according to patient size and/or use of iterative reconstruction technique. COMPARISON:  04/20/2022 FINDINGS: Lower chest: Unremarkable. Hepatobiliary: No focal abnormalities are seen in liver. There is no dilation of bile ducts. Surgical clips are seen in gallbladder fossa. Pancreas: No focal abnormalities are seen. Spleen: Unremarkable. Adrenals/Urinary Tract: Adrenals are unremarkable. There is no hydronephrosis. There is a 2 mm calculus in the midportion of left kidney. There is minimal calcification in the renal cortex in the lower pole of left kidney. Urinary bladder is not distended. Stomach/Bowel: Stomach is unremarkable. Small bowel loops are not dilated. Appendix is not dilated. Multiple diverticula are seen in colon without signs of focal acute diverticulitis. Vascular/Lymphatic: Unremarkable. Reproductive: Mild lobulations are seen in the margin of the uterus suggesting fibroids. There is 2.2 cm fluid density structure in the right side of the vagina suggesting Gartner duct cyst or Bartholin's gland cyst. There are few coarse calcifications in the inferior aspect of this low-density lesion. Other: There is no ascites or pneumoperitoneum. Small umbilical hernia containing fat is seen. Musculoskeletal: Unremarkable. IMPRESSION: There is no evidence of intestinal obstruction or pneumoperitoneum. There is no hydronephrosis. Appendix is not dilated. There is 2 mm left renal calculus. Status post cholecystectomy. Diverticulosis of colon without signs of focal acute diverticulitis. Possible uterine fibroids. There is interval appearance of 2.2 cm fluid density  structure in the right side of vagina suggesting Gartner duct cyst or Bartholin gland cyst. Other findings as described in the body of the report. Electronically Signed   By: Ernie Avena M.D.   On: 09/29/2022 12:54    Procedures Procedures    Medications Ordered in ED Medications  acetaminophen (TYLENOL) tablet 1,000 mg (1,000 mg Oral Given 09/29/22 2334)    ED Course/ Medical Decision Making/ A&P Clinical Course as of 09/30/22 0002  Wed Sep 29, 2022  2254 Stable 51 YOF with abdominal pain RUQ. HX of panc insuffeciency. History of Chole.  [CC]    Clinical Course User Index [CC] Glyn Ade, MD                           Medical Decision Making Amount and/or Complexity of Data Reviewed Labs: ordered.   This patient presents to the ED for concern of abdominal pain, this involves a number of treatment options, and is a  complaint that carries with it a high risk of complications and morbidity.  The differential diagnosis includes acute pancreatitis, obstructed renal stone, appendicitis and ovarian torsion.   Co morbidities: Discussed in HPI    EMR reviewed including pt PMHx, past surgical history and past visits to ER.   See HPI for more details   Lab Tests:   I personally reviewed all laboratory work and imaging. Metabolic panel without any acute abnormality specifically kidney function within normal limits and no significant electrolyte abnormalities. CBC without leukocytosis or significant anemia.  Pending labs will be reviewed by oncoming APP.   Imaging Studies:  Abnormal findings. I personally reviewed all imaging studies. Imaging notable for CT abdomen pelvis without contrast There is no evidence of intestinal obstruction or pneumoperitoneum.  There is no hydronephrosis. Appendix is not dilated.    There is 2 mm left renal calculus. Status post cholecystectomy.  Diverticulosis of colon without signs of focal acute diverticulitis.  Possible uterine  fibroids.    There is interval appearance of 2.2 cm fluid density structure in  the right side of vagina suggesting Gartner duct cyst or Bartholin  gland cyst.      Cardiac Monitoring:  The patient was maintained on a cardiac monitor.  I personally viewed and interpreted the cardiac monitored which showed an underlying rhythm of: NSR NA   Medicines ordered:  I ordered medication including Tylenol for pain.  Reevaluation of the patient after these medicines showed that the patient stayed the same I have reviewed the patients home medicines and have made adjustments as needed    Consults/Attending Physician    Discussed patient with attending ED physician.  Also signed out patient to oncoming APP who will continue monitoring this patient and providing treatment.   Reevaluation:  After the interventions noted above I re-evaluated patient and found that they have :stayed the same.  Patient will be reevaluated by oncoming   Problem List / ED Course: Patient presented for abdominal pain.  Patient endorsed right upper quadrant pain and lower abdominal pain.  Considered acute pancreatitis but unlikely given the results of CT scan.  Also considered appendicitis but unlikely given CT scan.  Considered ovarian torsion but unlikely given CT scan results and lower abdominal pain is bilateral.  Plan at this time will be pain management.  If symptoms improve will likely follow-up in outpatient setting with PCP.  At this time, signed out patient to oncoming APP.   Dispostion:  After consideration of the diagnostic results and the patients response to treatment, I feel that the patient would benefit from ongoing management for abdominal pain.          Final Clinical Impression(s) / ED Diagnoses Final diagnoses:  Generalized abdominal pain    Rx / DC Orders ED Discharge Orders     None         Gareth Eagle, PA-C 09/30/22 0002    Glyn Ade, MD 09/30/22  1447

## 2022-09-30 LAB — COMPREHENSIVE METABOLIC PANEL
ALT: 34 U/L (ref 0–44)
AST: 27 U/L (ref 15–41)
Albumin: 3.8 g/dL (ref 3.5–5.0)
Alkaline Phosphatase: 53 U/L (ref 38–126)
Anion gap: 10 (ref 5–15)
BUN: 11 mg/dL (ref 6–20)
CO2: 23 mmol/L (ref 22–32)
Calcium: 9.3 mg/dL (ref 8.9–10.3)
Chloride: 105 mmol/L (ref 98–111)
Creatinine, Ser: 0.8 mg/dL (ref 0.44–1.00)
GFR, Estimated: >60 mL/min (ref >60)
Glucose, Bld: 96 mg/dL (ref 70–99)
Potassium: 3.4 mmol/L — ABNORMAL LOW (ref 3.5–5.1)
Sodium: 138 mmol/L (ref 135–145)
Total Bilirubin: 0.7 mg/dL (ref 0.3–1.2)
Total Protein: 7 g/dL (ref 6.5–8.1)

## 2022-09-30 LAB — LIPASE, BLOOD: Lipase: 32 U/L (ref 11–51)

## 2022-09-30 MED ORDER — LACTATED RINGERS IV BOLUS
2000.0000 mL | Freq: Once | INTRAVENOUS | Status: AC
  Administered 2022-09-30: 2000 mL via INTRAVENOUS

## 2022-09-30 MED ORDER — DICYCLOMINE HCL 20 MG PO TABS: 20.0000 mg | ORAL_TABLET | Freq: Two times a day (BID) | ORAL | 0 refills | Status: DC

## 2022-09-30 MED ORDER — ONDANSETRON 4 MG PO TBDP: 4.0000 mg | ORAL_TABLET | Freq: Three times a day (TID) | ORAL | 0 refills | Status: DC | PRN

## 2022-09-30 NOTE — Discharge Instructions (Addendum)
Your work-up today was overall reassuring.  Blood work, CT scan did not show anything concerning.  There was Bartholin cyst which was noted on the CT scan.  This is not an abscess and does not need to be drained at this time.  I do recommend that you follow-up with your gynecologist.  If you do not have 1 I have attached information for women's clinic above.  You received medication in the emergency room as well as IV fluids.  Reported good control of your pain.  He stated you do not have a gastroenterologist to follow-up with.  Have attached information for gastroenterologist above for you that you can call and establish follow-up with.  Otherwise follow-up with your primary care provider.  For any concerning symptoms please return to the emergency room.

## 2022-09-30 NOTE — ED Provider Notes (Signed)
Signout received on this 51 year old female who presented with right upper quadrant abdominal pain.  She does have history of cholecystectomy.  History of pancreatic insufficiency recently started on pancreatic enzyme which she has been taking for the past 2 weeks.  States she no longer has a gastroenterologist.  Work-up so far has been significant for CBC without leukocytosis, hemoglobin of 15.3 likely hemoconcentrated, CMP with potassium 3.4 preserved renal function.  UA without evidence of UTI.  Lipase within normal limits.  CT abdomen pelvis without signs of pancreatitis, other acute intra-abdominal finding.  Bartholin cyst noted but no evidence of abscess.  She does endorse that she has had malodorous vaginal discharge for the past couple weeks however she states this is not unusual for her.  Denies pelvic pain, without flank pain.  Low suspicion for STI.  She is without dysuria.  At the time of signout she is awaiting reevaluation and pain control.  Physical Exam  BP (!) 149/96 (BP Location: Right Arm)   Pulse 72   Temp 97.7 F (36.5 C) (Oral)   Resp 14   SpO2 100%   Physical Exam  Procedures  Procedures  ED Course / MDM   Clinical Course as of 09/30/22 0305  Wed Sep 29, 2022  2254 Stable 51 YOF with abdominal pain RUQ. HX of panc insuffeciency. History of Chole.  [CC]    Clinical Course User Index [CC] Tretha Sciara, MD   Medical Decision Making Amount and/or Complexity of Data Reviewed Labs: ordered.  Risk OTC drugs.   During reevaluation patient reports good pain control however she is requesting IV fluids for dehydration.  2 liter bolus provided.  Reevaluation patient remains with relatively well-controlled pain.  I did offer pain medication which she defers.  We will provide her with a referral to gastroenterology, prescribe Bentyl and Zofran for symptom management.  Patient is appropriate for discharge.  Discharged in stable condition.  Return precautions  discussed.       Evlyn Courier, PA-C 09/30/22 3790    Maudie Flakes, MD 09/30/22 0730

## 2022-09-30 NOTE — ED Notes (Signed)
Patient verbalizes understanding of d/c instructions. Opportunities for questions and answers were provided. Pt d/c from ED and ambulated to waiting room

## 2022-11-23 ENCOUNTER — Emergency Department (HOSPITAL_BASED_OUTPATIENT_CLINIC_OR_DEPARTMENT_OTHER): Payer: BC Managed Care – PPO

## 2022-11-23 ENCOUNTER — Encounter (HOSPITAL_BASED_OUTPATIENT_CLINIC_OR_DEPARTMENT_OTHER): Payer: Self-pay | Admitting: Emergency Medicine

## 2022-11-23 ENCOUNTER — Emergency Department (HOSPITAL_BASED_OUTPATIENT_CLINIC_OR_DEPARTMENT_OTHER)
Admission: EM | Admit: 2022-11-23 | Discharge: 2022-11-24 | Disposition: A | Discharge status: Home / Self Care | Payer: BC Managed Care – PPO | Attending: Emergency Medicine | Admitting: Emergency Medicine

## 2022-11-23 ENCOUNTER — Other Ambulatory Visit: Payer: Self-pay

## 2022-11-23 DIAGNOSIS — R059 Cough, unspecified: Secondary | ICD-10-CM | POA: Diagnosis not present

## 2022-11-23 DIAGNOSIS — J45909 Unspecified asthma, uncomplicated: Secondary | ICD-10-CM | POA: Insufficient documentation

## 2022-11-23 DIAGNOSIS — R110 Nausea: Secondary | ICD-10-CM | POA: Insufficient documentation

## 2022-11-23 DIAGNOSIS — R0602 Shortness of breath: Secondary | ICD-10-CM | POA: Insufficient documentation

## 2022-11-23 DIAGNOSIS — R7989 Other specified abnormal findings of blood chemistry: Secondary | ICD-10-CM | POA: Diagnosis not present

## 2022-11-23 DIAGNOSIS — R079 Chest pain, unspecified: Secondary | ICD-10-CM | POA: Diagnosis present

## 2022-11-23 LAB — BASIC METABOLIC PANEL
Anion gap: 9 (ref 5–15)
BUN: 20 mg/dL (ref 6–20)
CO2: 25 mmol/L (ref 22–32)
Calcium: 9.3 mg/dL (ref 8.9–10.3)
Chloride: 103 mmol/L (ref 98–111)
Creatinine, Ser: 0.98 mg/dL (ref 0.44–1.00)
GFR, Estimated: >60 mL/min (ref >60)
Glucose, Bld: 102 mg/dL — ABNORMAL HIGH (ref 70–99)
Potassium: 3.4 mmol/L — ABNORMAL LOW (ref 3.5–5.1)
Sodium: 137 mmol/L (ref 135–145)

## 2022-11-23 LAB — CBC
HCT: 44.2 % (ref 36.0–46.0)
Hemoglobin: 14.5 g/dL (ref 12.0–15.0)
MCH: 27.8 pg (ref 26.0–34.0)
MCHC: 32.8 g/dL (ref 30.0–36.0)
MCV: 84.8 fL (ref 80.0–100.0)
Platelets: 312 10*3/uL (ref 150–400)
RBC: 5.21 MIL/uL — ABNORMAL HIGH (ref 3.87–5.11)
RDW: 12.9 % (ref 11.5–15.5)
WBC: 7.7 10*3/uL (ref 4.0–10.5)
nRBC: 0 % (ref 0.0–0.2)

## 2022-11-23 LAB — TROPONIN I (HIGH SENSITIVITY)
Troponin I (High Sensitivity): <2 ng/L (ref <18)
Troponin I (High Sensitivity): 3 ng/L (ref <18)

## 2022-11-23 LAB — D-DIMER, QUANTITATIVE: D-Dimer, Quant: 0.84 ug/mL-FEU — ABNORMAL HIGH (ref 0.00–0.50)

## 2022-11-23 MED ORDER — IPRATROPIUM-ALBUTEROL 0.5-2.5 (3) MG/3ML IN SOLN
3.0000 mL | Freq: Once | RESPIRATORY_TRACT | Status: AC
  Administered 2022-11-23: 3 mL via RESPIRATORY_TRACT
  Filled 2022-11-23: qty 3

## 2022-11-23 MED ORDER — KETOROLAC TROMETHAMINE 30 MG/ML IJ SOLN
15.0000 mg | Freq: Once | INTRAMUSCULAR | Status: AC
  Administered 2022-11-23: 15 mg via INTRAVENOUS
  Filled 2022-11-23: qty 1

## 2022-11-23 NOTE — ED Triage Notes (Signed)
Cough X 2 months has been seen for and given ABX now has chest, side, and breast pain. Seen by UC today was told to come in for a CT.

## 2022-11-23 NOTE — ED Provider Notes (Incomplete)
[facility] EMERGENCY DEPARTMENT Provider Note   CSN: [id number] Arrival date & time: 11/23/22  1930     History {Add pertinent medical, surgical, social history, OB history to HPI:1} Chief Complaint  Patient presents with  . Cough  . Chest Pain       Kortnee Bas is a 51 y.o. female.    51 year old female presents emergency department with complaints of 50-month history of cough.  Patient has been seen by primary care in urgent care outpatient prescribed oral antibiotics, prednisone as well as Jerilynn Som which has not helped her symptoms.  Was seen in urgent care earlier today to the emergency department for further evaluation.  Patient also reports substernal and just left of sternal chest pain that is worsened with coughing episodes as well as taking deep breath.  Patient also notes radiation to back and mid scapular region with symptoms relieved with administration of albuterol inhaler.  Patient reports episode of feeling nauseated earlier today without episodes of emesis.  Also reports associated shortness of breath.  Denies any known fever, chills, night sweats, productivity of cough, abdominal pain, vomiting, urinary/vaginal symptoms, change in bowel habits.  Patient states that she used her albuterol inhaler earlier today which helped her symptoms.  Denies history of DVT/PE, recent surgery/immobilization, hormone therapy, no malignancy, known coagulopathy.  Past medical history significant for IBS, anxiety, asthma  Home Medications Prior to Admission medications   Medication Sig Start Date End Date Taking? Authorizing Provider  albuterol (PROVENTIL HFA;VENTOLIN HFA) 108 (90 Base) MCG/ACT inhaler Inhale 2 puffs into the lungs every 6 (six) hours as needed for wheezing or shortness of breath.    [provider]  amoxicillin (AMOXIL) 500 MG capsule Take 1 capsule (500 mg total) by mouth 3 (three) times daily. 09/28/21   Kommor, Madison, MD  dicyclomine  (BENTYL) 20 MG tablet Take 1 tablet (20 mg total) by mouth 2 (two) times daily. 09/30/22   Karie Mainland, Amjad, PA-C  montelukast (SINGULAIR) 10 MG tablet Take 10 mg by mouth daily. 06/04/20   [provider]  Multiple Vitamin (MULITIVITAMIN WITH MINERALS) TABS Take 1 tablet by mouth daily.    [provider]  Omega-3 Fatty Acids (FISH OIL PO) Take by mouth.    [provider]  omeprazole (PRILOSEC) 20 MG capsule Take 20 mg by mouth daily.    [provider]  ondansetron (ZOFRAN-ODT) 4 MG disintegrating tablet Take 1 tablet (4 mg total) by mouth every 8 (eight) hours as needed for nausea or vomiting. 09/30/22   Karie Mainland, Amjad, PA-C  Triprolidine-Pseudoephedrine (ANTIHISTAMINE DECONGESTANT PO) Take by mouth.    [provider]      Allergies    Iodine-131, Nitrofurantoin macrocrystal, and Ivp dye [iodinated contrast media]    Review of Systems   Review of Systems  Respiratory:  Positive for cough.   Cardiovascular:  Positive for chest pain.  Musculoskeletal:  Negative for back pain.  All other systems reviewed and are negative.   Physical Exam Updated Vital Signs BP (!) 150/96 (BP Location: Right Arm)   Pulse 69   Temp 98.2 F (36.8 C) (Oral)   Resp 13   Ht 5\' 6"  (1.676 m)   Wt 89.8 kg   SpO2 99%   BMI 31.96 kg/m  Physical Exam Vitals and nursing note reviewed.  Constitutional:      General: She is not in acute distress.    Appearance: She is well-developed.  HENT:     Head: Normocephalic and  atraumatic.  Eyes:     Conjunctiva/sclera: Conjunctivae normal.  Cardiovascular:     Rate and Rhythm: Normal rate and regular rhythm.     Heart sounds: No murmur heard. Pulmonary:     Effort: Pulmonary effort is normal. No respiratory distress.     Breath sounds: Normal breath sounds. No decreased breath sounds, wheezing, rhonchi or rales.  Chest:     Chest wall: Tenderness present.     Comments: Patient chest pain reproducible over mid  sternum. Abdominal:     Palpations: Abdomen is soft.     Tenderness: There is no abdominal tenderness.  Musculoskeletal:        General: No swelling.     Cervical back: Neck supple.     Right lower leg: No edema.     Left lower leg: No edema.  Skin:    General: Skin is warm and dry.     Capillary Refill: Capillary refill takes less than 2 seconds.  Neurological:     Mental Status: She is alert.  Psychiatric:        Mood and Affect: Mood normal.    ED Results / Procedures / Treatments   Labs (all labs ordered are listed, but only abnormal results are displayed) Labs Reviewed  BASIC METABOLIC PANEL - Abnormal; Notable for the following components:      Result Value   Potassium 3.4 (*)    Glucose, Bld 102 (*)    All other components within normal limits  CBC - Abnormal; Notable for the following components:   RBC 5.21 (*)    All other components within normal limits  D-DIMER, QUANTITATIVE  TROPONIN I (HIGH SENSITIVITY)  TROPONIN I (HIGH SENSITIVITY)    EKG None  Radiology DG Chest 2 View  Result Date: 11/23/2022 CLINICAL DATA:  Cough EXAM: CHEST - 2 VIEW COMPARISON:  11/08/2022 FINDINGS: The heart size and mediastinal contours are within normal limits. Linear subsegmental atelectasis is identified in the right middle lobe and left base. There are thoracic degenerative changes. IMPRESSION: Linear subsegmental atelectasis bilaterally. Otherwise no acute cardiopulmonary process identified. Electronically Signed   By: Layla Maw M.D.   On: 11/23/2022 20:16    Procedures Procedures  {Document cardiac monitor, telemetry assessment procedure when appropriate:1}  Medications Ordered in ED Medications  ketorolac (TORADOL) 30 MG/ML injection 15 mg (has no administration in time range)  ipratropium-albuterol (DUONEB) 0.5-2.5 (3) MG/3ML nebulizer solution 3 mL (has no administration in time range)    ED Course/ Medical Decision Making/ A&P                            Medical Decision Making Amount and/or Complexity of Data Reviewed Labs: ordered. Radiology: ordered.  Risk Prescription drug management.   This patient presents to the ED for concern of chest pain, cough, this involves an extensive number of treatment options, and is a complaint that carries with it a high risk of complications and morbidity.  The differential diagnosis includes ACS, pneumonia, PE, pneumothorax, ***   Co morbidities that complicate the patient evaluation  See HPI   Additional history obtained:  Additional history obtained from EMR External records from outside source obtained and reviewed including ***   Lab Tests:  I Ordered, and personally interpreted labs.  The pertinent results include:  ***   Imaging Studies ordered:  I ordered imaging studies including ***  I independently visualized and interpreted imaging which showed *** I agree with  the radiologist interpretation   Cardiac Monitoring: / EKG:  The patient was maintained on a cardiac monitor.  I personally viewed and interpreted the cardiac monitored which showed an underlying rhythm of: ***   Consultations Obtained:  I requested consultation with the ***,  and discussed lab and imaging findings as well as pertinent plan - they recommend: ***   Problem List / ED Course / Critical interventions / Medication management  *** I ordered medication including ***  for ***  Reevaluation of the patient after these medicines showed that the patient {resolved/improved/worsened:23923::"improved"} I have reviewed the patients home medicines and have made adjustments as needed   Social Determinants of Health:  ***   Test / Admission - Considered:  Vitals signs significant for ***. Otherwise within normal range and stable throughout visit. Laboratory/imaging studies significant for: *** *** Worrisome signs and symptoms were discussed with the patient, and the patient acknowledged  understanding to return to the ED if noticed. Patient was stable upon discharge.    {Document critical care time when appropriate:1} {Document review of labs and clinical decision tools ie heart score, Chads2Vasc2 etc:1}  {Document your independent review of radiology images, and any outside records:1} {Document your discussion with family members, caretakers, and with consultants:1} {Document social determinants of health affecting pt's care:1} {Document your decision making why or why not admission, treatments were needed:1} Final Clinical Impression(s) / ED Diagnoses Final diagnoses:  None    Rx / DC Orders ED Discharge Orders     None

## 2022-11-24 MED ORDER — APIXABAN 2.5 MG PO TABS
10.0000 mg | ORAL_TABLET | Freq: Once | ORAL | Status: AC
  Administered 2022-11-24: 10 mg via ORAL
  Filled 2022-11-24: qty 4

## 2022-11-24 NOTE — Discharge Instructions (Addendum)
With your visit to the emergency department today was overall consistent with elevation of D-dimer.  As discussed, we have given you 1 dose of anticoagulation here in the emergency department.  Recommend continued outpatient therapy of anticoagulation by primary care provider.  Call primary care provider your earliest convenience tomorrow morning to set up VQ scan.  If symptoms worsen, return to facility with appropriate nuclear medicine as we discussed.  Please not hesitate to return to emergency department if the worrisome signs and symptoms we discussed become apparent.

## 2022-12-07 NOTE — Progress Notes (Deleted)
Office Visit    Patient Name: Latoya Schmidt Date of Encounter: 12/07/2022  Primary Care Provider:  [facility] Primary Cardiologist:  None Primary Electrophysiologist: None  Chief Complaint    Latoya Schmidt is a 51 y.o. female with PMH of HLD,asthma, anxiety, incomplete RBBB who presents today for follow-up of cough and fluctuating blood pressures.  Past Medical History    Past Medical History:  Diagnosis Date   Anxiety    Asthma    IBS (irritable bowel syndrome)    Ovarian cyst    Past Surgical History:  Procedure Laterality Date   CESAREAN SECTION     CHOLECYSTECTOMY     ERCP     ERCP     OVARIAN CYST REMOVAL     TUBAL LIGATION      Allergies  Allergies  Allergen Reactions   Iodine-131 Rash    Hives   Nitrofurantoin Macrocrystal Rash    rash   Ivp Dye [Iodinated Contrast Media] Hives    unknown    History of Present Illness    Latoya Schmidt  is a 51 year old female with the above mention past medical history who presents today for follow-up of cough and fluctuating blood pressures.  She was initially seen by Dr. Tomie China in 2020 for chest discomfort with elevated blood pressure and dyslipidemia.  She has a history of asthma and occasionally reported occasional chest tightness.  She reported high blood pressure at her PCP and during visit patient's blood pressure was 118/68.  LDLs were noted to be 137 and patient was sent for exercise stress echo and calcium scoring.  Calcium scoring was completed on 02/02/2019 revealing score of 0 with normal coronaries and no calcifications in the aorta.  Stress echo results revealed no abnormal findings with normal exercise response and a EF of 55 to 60% with no RWMA no valvular abnormalities.  Seen by Dr. Bing Matter on 12/2019 for complaint of chest tightness and shortness of breath.  She endorsed pain in her neck showed no acute changes.  She was encouraged to take Tylenol and advised  regarding management of psychological stress.  She reports palpitations in 03/2021 that were evaluated in the ED with no further intervention at that time.    She was seen in med Center 11/23/2022 with complaint of cough and chest pain that she noted radiating substernally and to the left of her sternum as well.  D-dimer was completed elevated at 0.84 troponins were negative with EKG showing no acute changes.  Blood pressure was noted to be elevated at 145/98.  She elected to follow-up with PCP for further evaluation.  She had a VQ scan completed 11/30/2022 that was normal.  Since last being seen in the office patient reports***.  Patient denies chest pain, palpitations, dyspnea, PND, orthopnea, nausea, vomiting, dizziness, syncope, edema, weight gain, or early satiety.     ***Notes:  Home Medications    Current Outpatient Medications  Medication Sig Dispense Refill   albuterol (PROVENTIL HFA;VENTOLIN HFA) 108 (90 Base) MCG/ACT inhaler Inhale 2 puffs into the lungs every 6 (six) hours as needed for wheezing or shortness of breath.     amoxicillin (AMOXIL) 500 MG capsule Take 1 capsule (500 mg total) by mouth 3 (three) times daily. 21 capsule 0   dicyclomine (BENTYL) 20 MG tablet Take 1 tablet (20 mg total) by mouth 2 (two) times daily. 20 tablet 0   montelukast (SINGULAIR) 10 MG tablet Take 10 mg by mouth  daily.     Multiple Vitamin (MULITIVITAMIN WITH MINERALS) TABS Take 1 tablet by mouth daily.     Omega-3 Fatty Acids (FISH OIL PO) Take by mouth.     omeprazole (PRILOSEC) 20 MG capsule Take 20 mg by mouth daily.     ondansetron (ZOFRAN-ODT) 4 MG disintegrating tablet Take 1 tablet (4 mg total) by mouth every 8 (eight) hours as needed for nausea or vomiting. 20 tablet 0   Triprolidine-Pseudoephedrine (ANTIHISTAMINE DECONGESTANT PO) Take by mouth.     No current facility-administered medications for this visit.     Review of Systems  Please see the history of present illness.     (+)*** (+)***  All other systems reviewed and are otherwise negative except as noted above.  Physical Exam    Wt Readings from Last 3 Encounters:  11/23/22 198 lb (89.8 kg)  06/02/22 192 lb (87.1 kg)  04/20/22 187 lb 6.3 oz (85 kg)   [id number] were no vitals filed for this visit.,There is no height or weight on file to calculate BMI.  Constitutional:      Appearance: Healthy appearance. Not in distress.  Neck:     Vascular: JVD normal.  Pulmonary:     Effort: Pulmonary effort is normal.     Breath sounds: No wheezing. No rales. Diminished in the bases Cardiovascular:     Normal rate. Regular rhythm. Normal S1. Normal S2.      Murmurs: There is no murmur.  Edema:    Peripheral edema absent.  Abdominal:     Palpations: Abdomen is soft non tender. There is no hepatomegaly.  Skin:    General: Skin is warm and dry.  Neurological:     General: No focal deficit present.     Mental Status: Alert and oriented to person, place and time.     Cranial Nerves: Cranial nerves are intact.  EKG/LABS/Other Studies Reviewed    ECG personally reviewed by me today - ***  Risk Assessment/Calculations:   {Does this patient have ATRIAL FIBRILLATION?:[phone]}        Lab Results  Component Value Date   WBC 7.7 11/23/2022   HGB 14.5 11/23/2022   HCT 44.2 11/23/2022   MCV 84.8 11/23/2022   PLT 312 11/23/2022   Lab Results  Component Value Date   CREATININE 0.98 11/23/2022   BUN 20 11/23/2022   NA 137 11/23/2022   K 3.4 (L) 11/23/2022   CL 103 11/23/2022   CO2 25 11/23/2022   Lab Results  Component Value Date   ALT 34 09/29/2022   AST 27 09/29/2022   ALKPHOS 53 09/29/2022   BILITOT 0.7 09/29/2022   No results found for: "CHOL", "HDL", "LDLCALC", "LDLDIRECT", "TRIG", "CHOLHDL"  No results found for: "HGBA1C"  Assessment & Plan    1.  Elevated blood pressure: -Patient was seen recently in the ED for complaint of cough and chest pain. -Blood pressures were documented  and elevated at 145/90 -Today patient's blood pressure is***  2.  Mixed hyperlipidemia: -Patient's last LDL was*** -  3.***  4.***      Disposition: Follow-up with None or APP in *** months {Are you ordering a CV Procedure (e.g. stress test, cath, DCCV, TEE, etc)?   Press F2        :[id number]   Medication Adjustments/Labs and Tests Ordered: Current medicines are reviewed at length with the patient today.  Concerns regarding medicines are outlined above.   Signed, Mable Fill, Marissa Nestle, NP 12/07/2022, 10:58 AM [facility]  Note:  This document was prepared using Dragon voice recognition software and may include unintentional dictation errors.

## 2022-12-08 ENCOUNTER — Ambulatory Visit: Payer: BC Managed Care – PPO | Attending: Nurse Practitioner | Admitting: Nurse Practitioner

## 2022-12-08 DIAGNOSIS — R030 Elevated blood-pressure reading, without diagnosis of hypertension: Secondary | ICD-10-CM

## 2023-04-11 ENCOUNTER — Telehealth: Payer: Self-pay | Admitting: *Deleted

## 2023-04-11 NOTE — Telephone Encounter (Signed)
Returned call from 3:11 PM. Left a message that she called this number in error. She is not a patient at this office. Had U/S with [facility].

## 2023-05-10 ENCOUNTER — Ambulatory Visit: Payer: 59 | Attending: Internal Medicine | Admitting: Internal Medicine

## 2023-05-10 ENCOUNTER — Encounter: Payer: Self-pay | Admitting: Internal Medicine

## 2023-05-10 VITALS — BP 130/88 | HR 90 | Ht 66.0 in | Wt 201.8 lb

## 2023-05-10 DIAGNOSIS — R0609 Other forms of dyspnea: Secondary | ICD-10-CM

## 2023-05-10 DIAGNOSIS — J45909 Unspecified asthma, uncomplicated: Secondary | ICD-10-CM

## 2023-05-10 DIAGNOSIS — I5020 Unspecified systolic (congestive) heart failure: Secondary | ICD-10-CM

## 2023-05-10 NOTE — Progress Notes (Signed)
Cardiology Office Note   Date:  05/10/2023   ID:  Latoya Schmidt, DOB [birth year], MRN [id number]  PCP:  [facility]  Cardiologist:   [name], MD    Pt presents for evaluation of CP   History of Present Illness: Latoya Schmidt is a 52 y.o. female who was previously followed by Dr Bing Matter   Hx of anxiety, IBS, atypical CP, mod OSA, asthma, GERD.  She had a calcium score  CT was  in 2020  Stress echo in 2020 was normal     The pt was last seen in cardiology by [name] in 2021  SInce seen the  pt notes SSCP and also L sided CP   Episodes are ntermittent   They last about 20 min to 1 hour   L sided CP worse if she lays  on L side   SSCP if does strenuous activity  Diet   Pt says over the past couple weeks her diet has changed   Now: Br:   Guacamole with sweet potato Lunch  Chicken breast with veggie Dinner   [place] spaghetti with zucchii  Fluids   Water   Green tea.  Current Meds  Medication Sig   albuterol (PROVENTIL HFA;VENTOLIN HFA) 108 (90 Base) MCG/ACT inhaler Inhale 2 puffs into the lungs every 6 (six) hours as needed for wheezing or shortness of breath.   amoxicillin (AMOXIL) 500 MG capsule Take 1 capsule (500 mg total) by mouth 3 (three) times daily.   CREON 36000-114000 units CPEP capsule Take 36,000 Units by mouth 3 (three) times daily before meals.   dicyclomine (BENTYL) 20 MG tablet Take 1 tablet (20 mg total) by mouth 2 (two) times daily.   montelukast (SINGULAIR) 10 MG tablet Take 10 mg by mouth daily.   Multiple Vitamin (MULITIVITAMIN WITH MINERALS) TABS Take 1 tablet by mouth daily.   Omega-3 Fatty Acids (FISH OIL PO) Take by mouth.   omeprazole (PRILOSEC) 20 MG capsule Take 20 mg by mouth daily.   ondansetron (ZOFRAN-ODT) 4 MG disintegrating tablet Take 1 tablet (4 mg total) by mouth every 8 (eight) hours as needed for nausea or vomiting.   Triprolidine-Pseudoephedrine (ANTIHISTAMINE DECONGESTANT PO) Take by mouth.      Allergies:   Iodine-131, Nitrofurantoin macrocrystal, and Ivp dye [iodinated contrast media]   Past Medical History:  Diagnosis Date   Anxiety    Asthma    IBS (irritable bowel syndrome)    Ovarian cyst     Past Surgical History:  Procedure Laterality Date   CESAREAN SECTION     CHOLECYSTECTOMY     ERCP     ERCP     OVARIAN CYST REMOVAL     TUBAL LIGATION       Social History:  The patient  reports that she has never smoked. She has never used smokeless tobacco. She reports that she does not currently use alcohol. She reports that she does not currently use drugs after having used the following drugs: Marijuana.   Family History:  The patient's family history is not on file.    ROS:  Please see the history of present illness. All other systems are reviewed and  Negative to the above problem except as noted.    PHYSICAL EXAM: VS:  BP 130/88   Pulse 90   Ht 5\' 6"  (1.676 m)   Wt 201 lb 12.8 oz (91.5 kg)   SpO2 95%   BMI 32.57 kg/m   GEN: Obese  52 yo  in no acute distress  HEENT: normal  Neck: no JVD, carotid bruits Cardiac: RRR; No murmur   No LE  edema  Respiratory:  clear to auscultation bilaterally GI: soft, nontender, nondistended, + BS  No hepatomegaly   EKG:  EKG is ordered today.  NSR 80 bpm   Q waves III, AVF     Lipid Panel No results found for: "CHOL", "TRIG", "HDL", "CHOLHDL", "VLDL", "LDLCALC", "LDLDIRECT"    Wt Readings from Last 3 Encounters:  05/10/23 201 lb 12.8 oz (91.5 kg)  11/23/22 198 lb (89.8 kg)  06/02/22 192 lb (87.1 kg)      ASSESSMENT AND PLAN:  1  Chest pain   Not completely typical for angina  Negatvie Ca scoreEKG with Q waves In III, AVF  ( Had in IIII nin past) Reocmm  Will set up with echo to evaluate LVEF  I would also recomm  PFTs for SOB    Current medicines are reviewed at length with the patient today.  The patient does not have concerns regarding medicines.  Signed, [name], MD  05/10/2023 1:57 PM    [facility] [address] [place], [place], [place]  [address] Phone: [phone]; Fax: [phone]

## 2023-05-10 NOTE — Patient Instructions (Signed)
Medication Instructions:   *If you need a refill on your cardiac medications before your next appointment, please call your pharmacy*   Lab Work:  If you have labs (blood work) drawn today and your tests are completely normal, you will receive your results only by: MyChart Message (if you have MyChart) OR A paper copy in the mail If you have any lab test that is abnormal or we need to change your treatment, we will call you to review the results.   Testing/Procedures:  Your physician has recommended that you have a pulmonary function test. Pulmonary Function Tests are a group of tests that measure how well air moves in and out of your lungs.  Your physician has requested that you have an echocardiogram. Echocardiography is a painless test that uses sound waves to create images of your heart. It provides your doctor with information about the size and shape of your heart and how well your heart's chambers and valves are working. This procedure takes approximately one hour. There are no restrictions for this procedure. Please do NOT wear cologne, perfume, aftershave, or lotions (deodorant is allowed). Please arrive 15 minutes prior to your appointment time.     Follow-Up: At [facility], you and your health needs are our priority.  As part of our continuing mission to provide you with exceptional heart care, we have created designated Provider Care Teams.  These Care Teams include your primary Cardiologist (physician) and Advanced Practice Providers (APPs -  Physician Assistants and Nurse Practitioners) who all work together to provide you with the care you need, when you need it.  We recommend signing up for the patient portal called "MyChart".  Sign up information is provided on this After Visit Summary.  MyChart is used to connect with patients for Virtual Visits (Telemedicine).  Patients are able to view lab/test results, encounter notes, upcoming appointments, etc.  Non-urgent  messages can be sent to your provider as well.   To learn more about what you can do with MyChart, go to [email or web address].    Your next appointment:  Will plan after testing if needed

## 2023-05-16 ENCOUNTER — Encounter (HOSPITAL_COMMUNITY): Payer: 59

## 2023-06-07 ENCOUNTER — Ambulatory Visit (HOSPITAL_COMMUNITY): Payer: 59 | Attending: Cardiology

## 2023-06-07 DIAGNOSIS — R0609 Other forms of dyspnea: Secondary | ICD-10-CM | POA: Diagnosis not present

## 2023-06-07 DIAGNOSIS — I77810 Thoracic aortic ectasia: Secondary | ICD-10-CM | POA: Diagnosis not present

## 2023-06-07 LAB — ECHOCARDIOGRAM COMPLETE
Area-P 1/2: 4.39 cm2
S' Lateral: 2.7 cm

## 2023-06-21 ENCOUNTER — Encounter: Payer: Self-pay | Admitting: Internal Medicine

## 2023-06-21 DIAGNOSIS — R0609 Other forms of dyspnea: Secondary | ICD-10-CM

## 2023-12-05 ENCOUNTER — Emergency Department (HOSPITAL_BASED_OUTPATIENT_CLINIC_OR_DEPARTMENT_OTHER)
Admission: EM | Admit: 2023-12-05 | Discharge: 2023-12-05 | Disposition: A | Discharge status: Home / Self Care | Payer: 59 | Attending: Emergency Medicine | Admitting: Emergency Medicine

## 2023-12-05 ENCOUNTER — Encounter (HOSPITAL_BASED_OUTPATIENT_CLINIC_OR_DEPARTMENT_OTHER): Payer: Self-pay | Admitting: Emergency Medicine

## 2023-12-05 ENCOUNTER — Other Ambulatory Visit: Payer: Self-pay

## 2023-12-05 ENCOUNTER — Emergency Department (HOSPITAL_BASED_OUTPATIENT_CLINIC_OR_DEPARTMENT_OTHER): Payer: 59

## 2023-12-05 DIAGNOSIS — J45909 Unspecified asthma, uncomplicated: Secondary | ICD-10-CM | POA: Insufficient documentation

## 2023-12-05 DIAGNOSIS — R110 Nausea: Secondary | ICD-10-CM | POA: Insufficient documentation

## 2023-12-05 DIAGNOSIS — R1011 Right upper quadrant pain: Secondary | ICD-10-CM | POA: Insufficient documentation

## 2023-12-05 DIAGNOSIS — R1031 Right lower quadrant pain: Secondary | ICD-10-CM | POA: Diagnosis not present

## 2023-12-05 DIAGNOSIS — K5790 Diverticulosis of intestine, part unspecified, without perforation or abscess without bleeding: Secondary | ICD-10-CM

## 2023-12-05 DIAGNOSIS — R1084 Generalized abdominal pain: Secondary | ICD-10-CM

## 2023-12-05 LAB — CBC
HCT: 44.8 % (ref 36.0–46.0)
Hemoglobin: 14.6 g/dL (ref 12.0–15.0)
MCH: 27.3 pg (ref 26.0–34.0)
MCHC: 32.6 g/dL (ref 30.0–36.0)
MCV: 83.9 fL (ref 80.0–100.0)
Platelets: 320 10*3/uL (ref 150–400)
RBC: 5.34 MIL/uL — ABNORMAL HIGH (ref 3.87–5.11)
RDW: 13.2 % (ref 11.5–15.5)
WBC: 7.1 10*3/uL (ref 4.0–10.5)
nRBC: 0 % (ref 0.0–0.2)

## 2023-12-05 LAB — COMPREHENSIVE METABOLIC PANEL
ALT: 39 U/L (ref 0–44)
AST: 25 U/L (ref 15–41)
Albumin: 4.3 g/dL (ref 3.5–5.0)
Alkaline Phosphatase: 61 U/L (ref 38–126)
Anion gap: 9 (ref 5–15)
BUN: 12 mg/dL (ref 6–20)
CO2: 26 mmol/L (ref 22–32)
Calcium: 9.5 mg/dL (ref 8.9–10.3)
Chloride: 104 mmol/L (ref 98–111)
Creatinine, Ser: 0.82 mg/dL (ref 0.44–1.00)
GFR, Estimated: >60 mL/min (ref >60)
Glucose, Bld: 106 mg/dL — ABNORMAL HIGH (ref 70–99)
Potassium: 3.8 mmol/L (ref 3.5–5.1)
Sodium: 139 mmol/L (ref 135–145)
Total Bilirubin: 0.8 mg/dL (ref <1.2)
Total Protein: 7.5 g/dL (ref 6.5–8.1)

## 2023-12-05 LAB — URINALYSIS, ROUTINE W REFLEX MICROSCOPIC
Bilirubin Urine: NEGATIVE
Glucose, UA: NEGATIVE mg/dL
Hgb urine dipstick: NEGATIVE
Ketones, ur: NEGATIVE mg/dL
Nitrite: NEGATIVE
Protein, ur: NEGATIVE mg/dL
Specific Gravity, Urine: 1.02 (ref 1.005–1.030)
pH: 5.5 (ref 5.0–8.0)

## 2023-12-05 LAB — URINALYSIS, MICROSCOPIC (REFLEX)

## 2023-12-05 LAB — LIPASE, BLOOD: Lipase: 28 U/L (ref 11–51)

## 2023-12-05 LAB — PREGNANCY, URINE: Preg Test, Ur: NEGATIVE

## 2023-12-05 MED ORDER — FLUCONAZOLE 200 MG PO TABS: 200.0000 mg | ORAL_TABLET | Freq: Every day | ORAL | 0 refills | Status: DC

## 2023-12-05 MED ORDER — ONDANSETRON HCL 4 MG PO TABS: 4.0000 mg | ORAL_TABLET | Freq: Four times a day (QID) | ORAL | 0 refills | Status: AC

## 2023-12-05 MED ORDER — AMOXICILLIN-POT CLAVULANATE 875-125 MG PO TABS
1.0000 | ORAL_TABLET | Freq: Once | ORAL | Status: AC
  Administered 2023-12-05: 1 via ORAL
  Filled 2023-12-05: qty 1

## 2023-12-05 MED ORDER — AMOXICILLIN-POT CLAVULANATE 875-125 MG PO TABS: 1.0000 | ORAL_TABLET | Freq: Two times a day (BID) | ORAL | 0 refills | Status: DC

## 2023-12-05 MED ORDER — AMOXICILLIN-POT CLAVULANATE 875-125 MG PO TABS: 1.0000 | ORAL_TABLET | Freq: Two times a day (BID) | ORAL | 0 refills | Status: AC

## 2023-12-05 MED ORDER — FLUCONAZOLE 200 MG PO TABS: 200.0000 mg | ORAL_TABLET | Freq: Every day | ORAL | 0 refills | Status: AC

## 2023-12-05 MED ORDER — ONDANSETRON HCL 4 MG PO TABS: 4.0000 mg | ORAL_TABLET | Freq: Four times a day (QID) | ORAL | 0 refills | Status: DC

## 2023-12-05 MED ORDER — KETOROLAC TROMETHAMINE 30 MG/ML IJ SOLN
30.0000 mg | Freq: Once | INTRAMUSCULAR | Status: AC
  Administered 2023-12-05: 30 mg via INTRAVENOUS
  Filled 2023-12-05: qty 1

## 2023-12-05 MED ORDER — ONDANSETRON HCL 4 MG/2ML IJ SOLN
4.0000 mg | Freq: Once | INTRAMUSCULAR | Status: AC
  Administered 2023-12-05: 4 mg via INTRAVENOUS
  Filled 2023-12-05: qty 2

## 2023-12-05 NOTE — Discharge Instructions (Addendum)
You had blood work a CT scan and urine sample performed today.  We do not see signs of any life-threatening infection or surgical emergencies on your workup.  You do have diverticulosis and I included information to read about this condition.  Sometimes your diverticulosis can become inflamed.  This can lead to diverticulitis.  I thought it was reasonable to treat you with an antibiotic called Augmentin for this.  There is also a small possibility of a urinary tract infection based on your urine sample.  We will send a urine culture to see if there is truly bacteria in the urine.  In the meantime, the antibiotic ought to treat for a urinary infection as well.  If you develop symptoms of a yeast infection you can take the Diflucan tablet prescribed.  You continue taking ibuprofen and Tylenol as needed at home for pain.  You can also use heating packs on your stomach for cramping stomach pain.  If your pain is getting worse, or if you have persistent vomiting, cannot drink water, bloody diarrhea, inability to pass gas or stool, please return to the emergency department.

## 2023-12-05 NOTE — ED Provider Notes (Signed)
[facility] [facility] Provider Note   CSN: [id number] Arrival date & time: 12/05/23  1138     History  Chief Complaint  Patient presents with   Flank Pain    right    Latoya Schmidt is a 52 y.o. female who presents with complaints of abdominal pain and nausea that started yesterday morning.  She was evaluated at urgent care earlier today and referred here for imaging.  Pain appears to involve right flank right upper quadrant and into right lower quadrant.  She has a history of cholecystectomy.  Still has her appendix.  She endorses nausea without vomiting, no diarrhea.  Denies any chest pain or shortness of breath.  She has been told that she has kidney stones in the past but has never passed a stone before, " unsure what that would feel like".  She denies any dysuria or increased frequency to me.  Unsure of any hematuria.  Does not drink alcohol.  Flank Pain      Past Medical History:  Diagnosis Date   Anxiety    Asthma    IBS (irritable bowel syndrome)    Ovarian cyst      Home Medications Prior to Admission medications   Medication Sig Start Date End Date Taking? Authorizing Provider  albuterol (PROVENTIL HFA;VENTOLIN HFA) 108 (90 Base) MCG/ACT inhaler Inhale 2 puffs into the lungs every 6 (six) hours as needed for wheezing or shortness of breath.    [provider]  amoxicillin (AMOXIL) 500 MG capsule Take 1 capsule (500 mg total) by mouth 3 (three) times daily. 09/28/21   Kommor, [place], MD  CREON 10272-536644 units CPEP capsule Take 36,000 Units by mouth 3 (three) times daily before meals.    [provider]  dicyclomine (BENTYL) 20 MG tablet Take 1 tablet (20 mg total) by mouth 2 (two) times daily. 09/30/22   Karie Mainland, Amjad, PA-C  montelukast (SINGULAIR) 10 MG tablet Take 10 mg by mouth daily. 06/04/20   [provider]  Multiple Vitamin (MULITIVITAMIN WITH MINERALS) TABS Take 1 tablet by mouth daily.     [provider]  Omega-3 Fatty Acids (FISH OIL PO) Take by mouth.    [provider]  omeprazole (PRILOSEC) 20 MG capsule Take 20 mg by mouth daily.    [provider]  ondansetron (ZOFRAN-ODT) 4 MG disintegrating tablet Take 1 tablet (4 mg total) by mouth every 8 (eight) hours as needed for nausea or vomiting. 09/30/22   Karie Mainland, Amjad, PA-C  Triprolidine-Pseudoephedrine (ANTIHISTAMINE DECONGESTANT PO) Take by mouth.    [provider]      Allergies    Iodine-131, Nitrofurantoin macrocrystal, and Ivp dye [iodinated contrast media]    Review of Systems   Review of Systems  Genitourinary:  Positive for flank pain.    Physical Exam Updated Vital Signs BP 137/87   Pulse 93   Temp 97.7 F (36.5 C) (Oral)   Resp 20   Wt 89.8 kg   SpO2 100%   BMI 31.96 kg/m  Physical Exam Vitals and nursing note reviewed.  Constitutional:      General: She is not in acute distress.    Appearance: She is well-developed.  HENT:     Head: Normocephalic and atraumatic.  Eyes:     Conjunctiva/sclera: Conjunctivae normal.  Cardiovascular:     Rate and Rhythm: Normal rate and regular rhythm.     Heart sounds: No murmur heard. Pulmonary:     Effort: Pulmonary  effort is normal. No respiratory distress.     Breath sounds: Normal breath sounds.  Abdominal:     Palpations: Abdomen is soft.     Tenderness: There is no right CVA tenderness, left CVA tenderness, guarding or rebound.     Comments: Tenderness involving right flank, right upper quadrant and right lower quadrant.  No overlying skin changes or lesions.  No peritoneal signs.  Musculoskeletal:        General: No swelling.     Cervical back: Neck supple.  Skin:    General: Skin is warm and dry.     Capillary Refill: Capillary refill takes less than 2 seconds.  Neurological:     Mental Status: She is alert.  Psychiatric:        Mood and Affect: Mood normal.     ED Results / Procedures / Treatments    Labs (all labs ordered are listed, but only abnormal results are displayed) Labs Reviewed  CBC - Abnormal; Notable for the following components:      Result Value   RBC 5.34 (*)    All other components within normal limits  URINALYSIS, ROUTINE W REFLEX MICROSCOPIC - Abnormal; Notable for the following components:   Leukocytes,Ua SMALL (*)    All other components within normal limits  URINALYSIS, MICROSCOPIC (REFLEX) - Abnormal; Notable for the following components:   Bacteria, UA RARE (*)    All other components within normal limits  PREGNANCY, URINE  LIPASE, BLOOD  COMPREHENSIVE METABOLIC PANEL    EKG None  Radiology No results found.  Procedures Procedures    Medications Ordered in ED Medications  ondansetron (ZOFRAN) injection 4 mg (4 mg Intravenous Given 12/05/23 1218)  ketorolac (TORADOL) 30 MG/ML injection 30 mg (30 mg Intravenous Given 12/05/23 1218)    ED Course/ Medical Decision Making/ A&P                                 Medical Decision Making Amount and/or Complexity of Data Reviewed Labs: ordered.   This patient presents to the ED with chief complaint(s) of abdominal pain.  The complaint involves an extensive differential diagnosis and also carries with it a high risk of complications and morbidity.   pertinent past medical history as listed in HPI  The differential diagnosis includes  Nephro lithiasis, pyelonephritis, pancreatitis, appendicitis, gastroenteritis The initial plan is to  Obtain basic labs, imaging pending labs Additional history obtained: No additional historians Records reviewed Care Everywhere/External Records  Initial Assessment:   Patient hemodynamically stable, afebrile.  Abdominal exam is generalized but does appear to be worse right flank, right upper quadrant and right lower quadrant.  Does have a history of cholecystectomy most suspicious for nephrolithiasis, versus pyelonephritis versus appendicitis.  Independent ECG  interpretation:  None  Independent labs interpretation:  The following labs were independently interpreted:  Labs pending  Independent visualization and interpretation of imaging: Will order imaging based off labs Treatment and Reassessment: Following for assessment patient given Toradol and Zofran  Consultations obtained:   None at this time  Disposition:   Pending workup.  Signout given to Dr. Renaye Rakers   Social Determinants of Health:   None  This note was dictated with voice recognition software.  Despite best efforts at proofreading, errors may have occurred which can change the documentation meaning.          Final Clinical Impression(s) / ED Diagnoses Final diagnoses:  Right flank  pain    Rx / DC Orders ED Discharge Orders     None         Fabienne Bruns 12/05/23 1252    Terald Sleeper, MD 12/05/23 [phone]

## 2023-12-05 NOTE — ED Triage Notes (Signed)
Right flank radiating to right abdomen and lower abdomen , sent from PCP to rule out appendicitis . Pain started last night .  Reports nausea with no emesis . Hx kidney stone.

## 2023-12-05 NOTE — ED Notes (Signed)
Discharge instructions reviewed with patient. Patient verbalizes understanding, no further questions at this time. Medications/prescriptions and follow up information provided. No acute distress noted at time of departure.  

## 2023-12-07 LAB — URINE CULTURE: Culture: 10000 — AB

## 2023-12-08 ENCOUNTER — Telehealth (HOSPITAL_BASED_OUTPATIENT_CLINIC_OR_DEPARTMENT_OTHER): Payer: Self-pay | Admitting: *Deleted

## 2023-12-08 NOTE — Telephone Encounter (Signed)
Post ED Visit - Positive Culture Follow-up  Culture report reviewed by antimicrobial stewardship pharmacist: [facility] [x]  Daylene Posey, Pharm.D. []  Celedonio Miyamoto, Pharm.D., BCPS AQ-ID []  Garvin Fila, Pharm.D., BCPS []  Georgina Pillion, Pharm.D., BCPS []  [place], [address].D., BCPS, AAHIVP []  Estella Husk, Pharm.D., BCPS, AAHIVP []  Lysle Pearl, PharmD, BCPS []  Phillips Climes, PharmD, BCPS []  Agapito Games, PharmD, BCPS []  Verlan Friends, PharmD []  Mervyn Gay, PharmD, BCPS []  Vinnie Level, PharmD  [facility] []  Len Childs, PharmD []  Greer Pickerel, PharmD []  Adalberto Cole, PharmD []  Perlie Gold, Rph []  Lonell Face) Jean Rosenthal, PharmD []  Earl Many, PharmD []  Junita Push, PharmD []  Dorna Leitz, PharmD []  Terrilee Files, PharmD []  Lynann Beaver, PharmD []  Keturah Barre, PharmD []  Loralee Pacas, PharmD []  Bernadene Person, PharmD   Positive urine culture Do not treat and no further patient follow-up is required at this time. Gala Lewandowsky, PA-C  Virl Axe Bayard 12/08/2023, 9:03 AM

## 2023-12-18 IMAGING — CT CT HEAD W/O CM
3 series · 16 of 47 positions shown, 19 images · non-contrast
Comparison: CT head 01/11/2019

CLINICAL DATA: Head trauma, moderate-severe



[Series 2: head wo · axial · 0.43mm/px · z∈[-146,-1]mm · 10 of 35 slices shown, 13 images]
[im 3/35  brain]
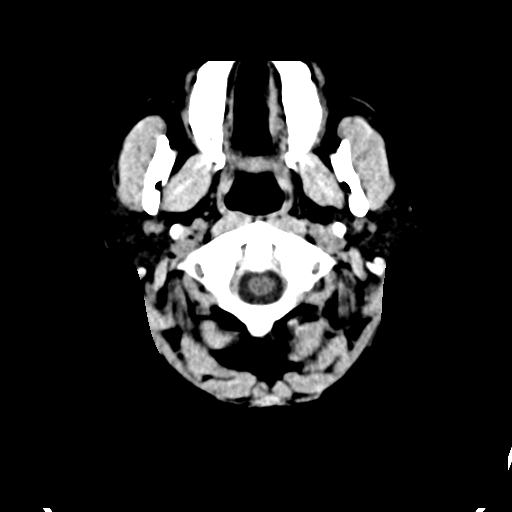
[im 3/35  bone]
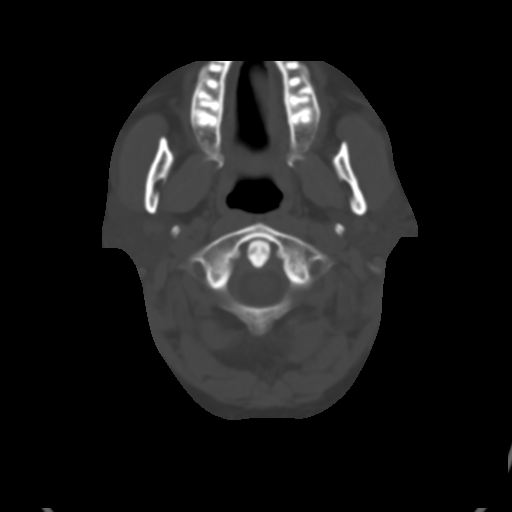
[im 6/35  brain]
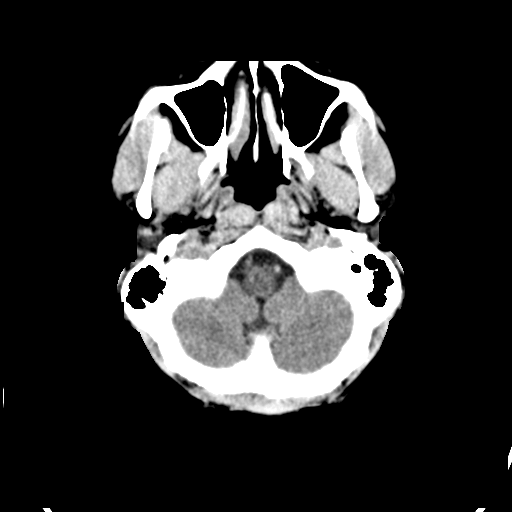
[im 10/35  brain]
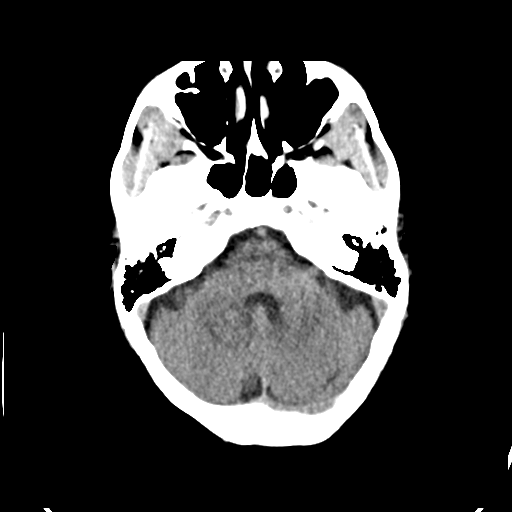
[im 12/35  brain]
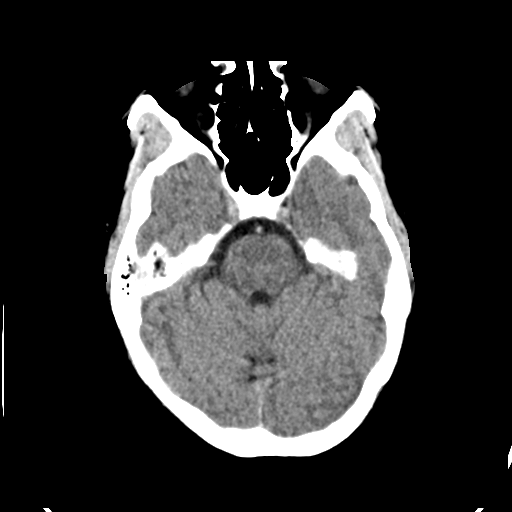
[im 16/35  brain]
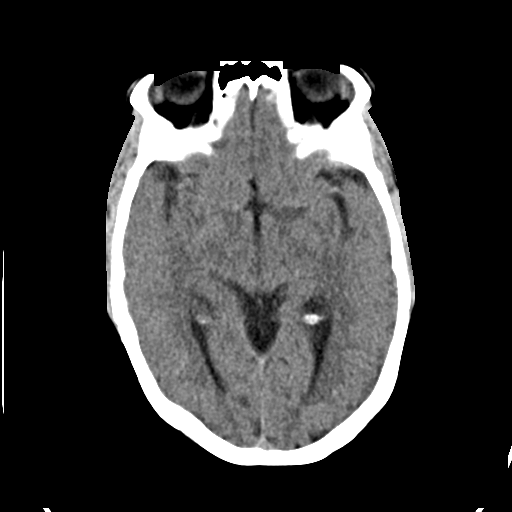
[im 16/35  bone]
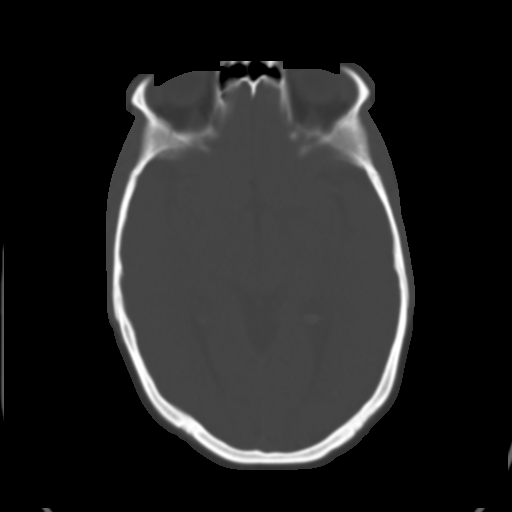
[im 19/35  brain]
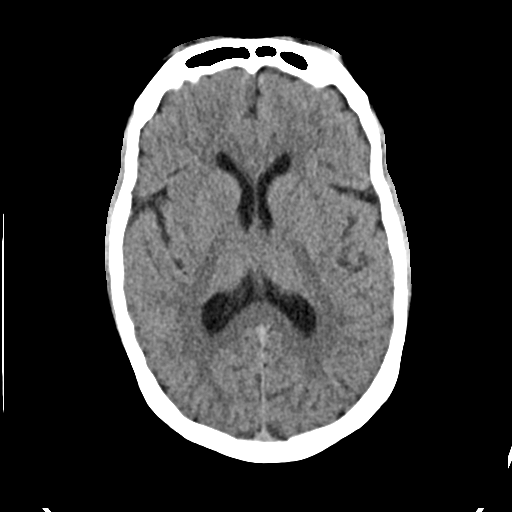
[im 23/35  brain]
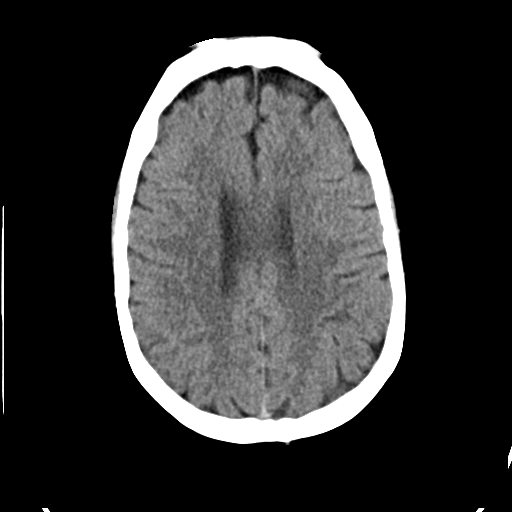
[im 26/35  brain]
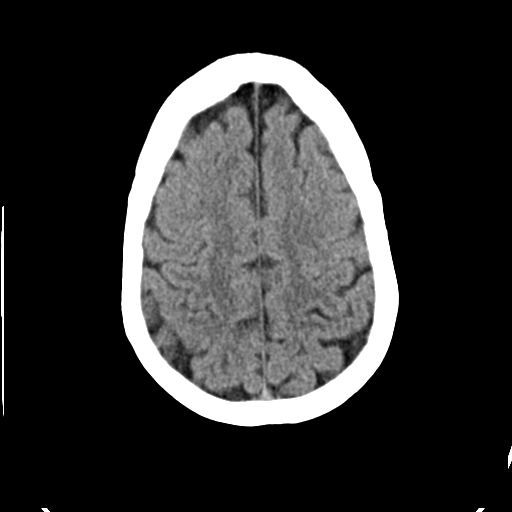
[im 29/35  brain]
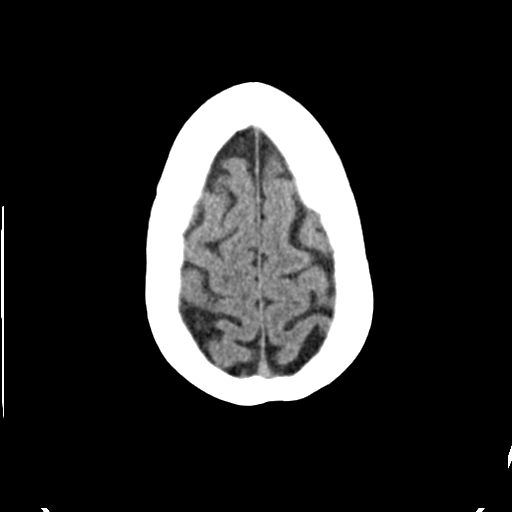
[im 29/35  bone]
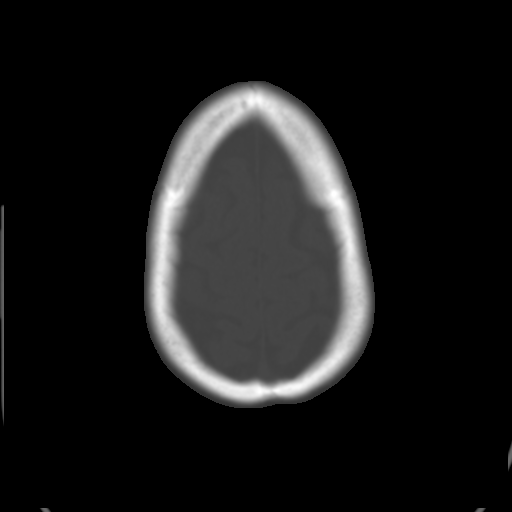
[im 32/35  brain]
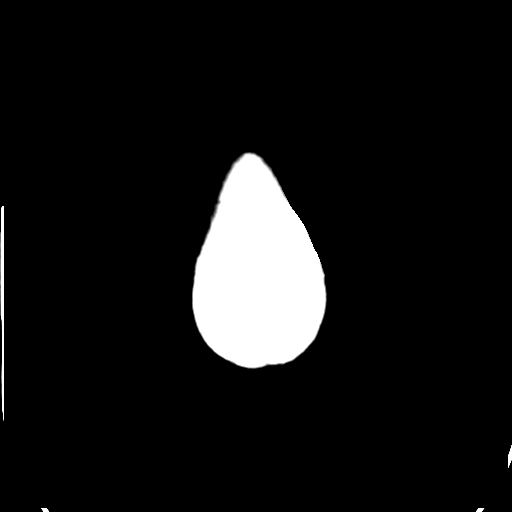

[Series 4: coronal soft · coronal · 0.35mm/px · 3 of 70 slices shown]
[im 24/70  brain]
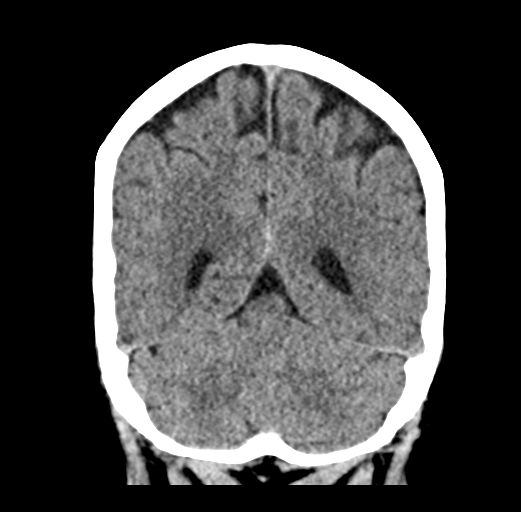
[im 31/70  brain]
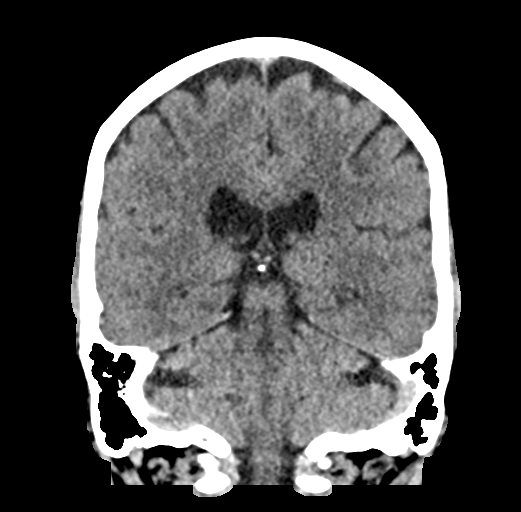
[im 39/70  brain]
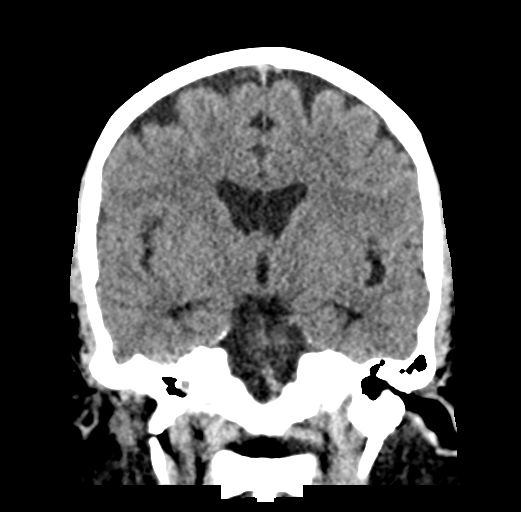

[Series 5: sag soft · sagittal · 0.34mm/px · 3 of 59 slices shown]
[im 20/59  brain]
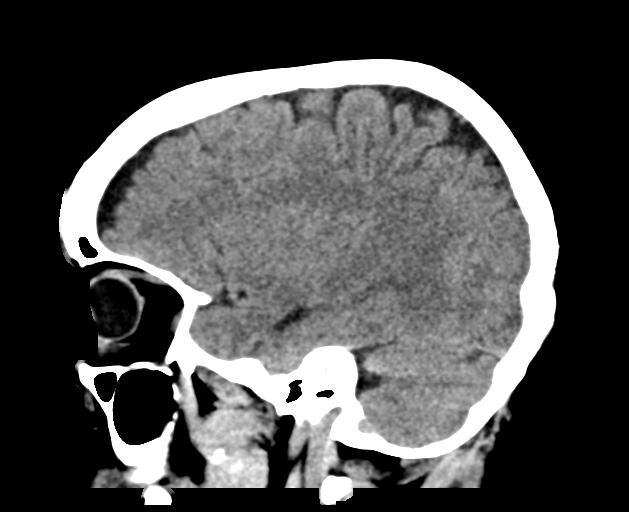
[im 30/59  brain]
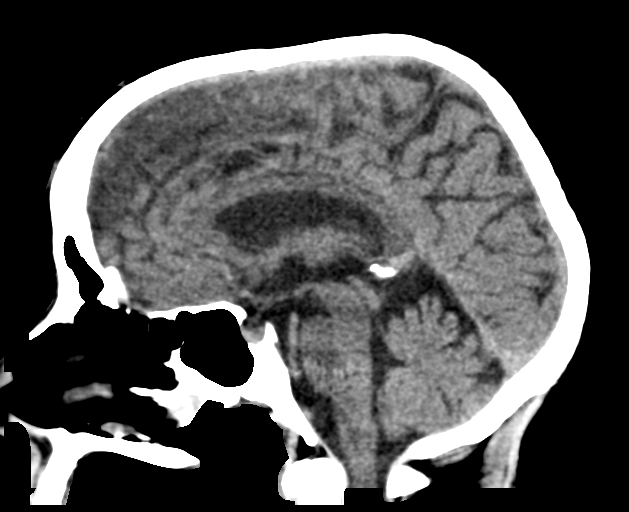
[im 39/59  brain]
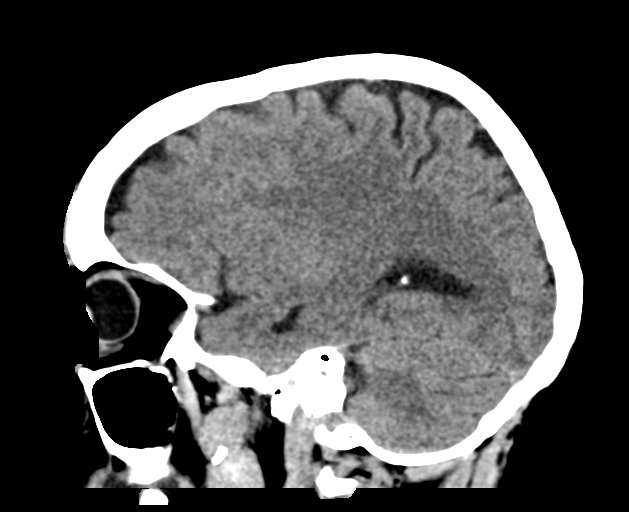

[16 of 47 positions shown; findings below may reference images not displayed]

FINDINGS: Brain:

No evidence of large-territorial acute infarction. No parenchymal
hemorrhage. No mass lesion. No extra-axial collection.

No mass effect or midline shift. No hydrocephalus. Basilar cisterns
are patent.

Vascular: No hyperdense vessel.

Skull: No acute fracture or focal lesion.

Sinuses/Orbits: Paranasal sinuses and mastoid air cells are clear.
The orbits are unremarkable.

Other: None.
IMPRESSION: No acute intracranial abnormality.

## 2024-01-01 ENCOUNTER — Other Ambulatory Visit: Payer: Self-pay

## 2024-01-01 ENCOUNTER — Encounter (HOSPITAL_COMMUNITY): Payer: Self-pay

## 2024-01-01 ENCOUNTER — Emergency Department (HOSPITAL_COMMUNITY)
Admission: EM | Admit: 2024-01-01 | Discharge: 2024-01-02 | Disposition: A | Discharge status: Home / Self Care | Payer: 59 | Attending: Emergency Medicine | Admitting: Emergency Medicine

## 2024-01-01 DIAGNOSIS — R1013 Epigastric pain: Secondary | ICD-10-CM | POA: Insufficient documentation

## 2024-01-01 DIAGNOSIS — R638 Other symptoms and signs concerning food and fluid intake: Secondary | ICD-10-CM | POA: Insufficient documentation

## 2024-01-01 DIAGNOSIS — R509 Fever, unspecified: Secondary | ICD-10-CM | POA: Insufficient documentation

## 2024-01-01 LAB — URINALYSIS, ROUTINE W REFLEX MICROSCOPIC
Bilirubin Urine: NEGATIVE
Glucose, UA: NEGATIVE mg/dL
Hgb urine dipstick: NEGATIVE
Ketones, ur: 80 mg/dL — AB
Leukocytes,Ua: NEGATIVE
Nitrite: NEGATIVE
Protein, ur: NEGATIVE mg/dL
Specific Gravity, Urine: 1.024 (ref 1.005–1.030)
pH: 5 (ref 5.0–8.0)

## 2024-01-01 LAB — CBC
HCT: 46.7 % — ABNORMAL HIGH (ref 36.0–46.0)
Hemoglobin: 15.4 g/dL — ABNORMAL HIGH (ref 12.0–15.0)
MCH: 27.7 pg (ref 26.0–34.0)
MCHC: 33 g/dL (ref 30.0–36.0)
MCV: 84.1 fL (ref 80.0–100.0)
Platelets: 307 10*3/uL (ref 150–400)
RBC: 5.55 MIL/uL — ABNORMAL HIGH (ref 3.87–5.11)
RDW: 12.8 % (ref 11.5–15.5)
WBC: 5.5 10*3/uL (ref 4.0–10.5)
nRBC: 0 % (ref 0.0–0.2)

## 2024-01-01 LAB — COMPREHENSIVE METABOLIC PANEL
ALT: 78 U/L — ABNORMAL HIGH (ref 0–44)
AST: 53 U/L — ABNORMAL HIGH (ref 15–41)
Albumin: 3.9 g/dL (ref 3.5–5.0)
Alkaline Phosphatase: 58 U/L (ref 38–126)
Anion gap: 10 (ref 5–15)
BUN: 13 mg/dL (ref 6–20)
CO2: 26 mmol/L (ref 22–32)
Calcium: 9.5 mg/dL (ref 8.9–10.3)
Chloride: 105 mmol/L (ref 98–111)
Creatinine, Ser: 0.84 mg/dL (ref 0.44–1.00)
GFR, Estimated: >60 mL/min (ref >60)
Glucose, Bld: 107 mg/dL — ABNORMAL HIGH (ref 70–99)
Potassium: 3.8 mmol/L (ref 3.5–5.1)
Sodium: 141 mmol/L (ref 135–145)
Total Bilirubin: 1.4 mg/dL — ABNORMAL HIGH (ref 0.0–1.2)
Total Protein: 7.4 g/dL (ref 6.5–8.1)

## 2024-01-01 LAB — HCG, SERUM, QUALITATIVE: Preg, Serum: NEGATIVE

## 2024-01-01 LAB — LIPASE, BLOOD: Lipase: 34 U/L (ref 11–51)

## 2024-01-01 NOTE — ED Triage Notes (Signed)
 Pt arrived from home via POV c/o abd pain that has gotten much worse over the las few days. Pt states that she was diagnosed with diverticulitis approx a month ago when the pain initially began. Pt states that now she is intermittently running fevers and the pain is much worse.

## 2024-01-02 ENCOUNTER — Emergency Department (HOSPITAL_COMMUNITY): Payer: 59

## 2024-01-02 MED ORDER — SODIUM CHLORIDE 0.9 % IV BOLUS
1000.0000 mL | Freq: Once | INTRAVENOUS | Status: AC
  Administered 2024-01-02: 1000 mL via INTRAVENOUS

## 2024-01-02 MED ORDER — ACETAMINOPHEN 325 MG PO TABS
650.0000 mg | ORAL_TABLET | Freq: Once | ORAL | Status: AC
  Administered 2024-01-02: 650 mg via ORAL
  Filled 2024-01-02: qty 2

## 2024-01-02 NOTE — ED Provider Notes (Signed)
 [facility] Provider Note   CSN: [id number] Arrival date & time: 01/01/24  1843     History  Chief Complaint  Patient presents with   Abdominal Pain    Love Chowning is a 53 y.o. female.   Abdominal Pain Laria Grimmett is a 53 y.o. female who presents to the Emergency Department complaining of abdominal pain.  She presents to the emergency department for evaluation of 1 month of intermittent abdominal pain.  She initially was evaluated in the emergency department for similar symptoms 1 month ago and was treated with antibiotics for diverticulitis with improvement in her symptoms.  About 2 weeks ago she had recurrent symptoms of epigastric abdominal pain that radiates to her back with associated poor appetite with occasional loose stools.  Subjective fevers but does not have a thermometer at home.  Urine is dark but there is no dysuria.  No known medical problems and takes no routine medications.  History of prior cholecystectomy.     Home Medications Prior to Admission medications   Medication Sig Start Date End Date Taking? Authorizing Provider  albuterol  (PROVENTIL  HFA;VENTOLIN  HFA) 108 (90 Base) MCG/ACT inhaler Inhale 2 puffs into the lungs every 6 (six) hours as needed for wheezing or shortness of breath.    [provider]  amoxicillin  (AMOXIL ) 500 MG capsule Take 1 capsule (500 mg total) by mouth 3 (three) times daily. 09/28/21   Kommor, Madison, MD  CREON 36000-114000 units CPEP capsule Take 36,000 Units by mouth 3 (three) times daily before meals.    [provider]  dicyclomine  (BENTYL ) 20 MG tablet Take 1 tablet (20 mg total) by mouth 2 (two) times daily. 09/30/22   Hildegard, Amjad, PA-C  montelukast (SINGULAIR) 10 MG tablet Take 10 mg by mouth daily. 06/04/20   [provider]  Multiple Vitamin (MULITIVITAMIN WITH MINERALS) TABS Take 1 tablet by mouth daily.    [provider]  Omega-3 Fatty Acids  (FISH OIL PO) Take by mouth.    [provider]  omeprazole  (PRILOSEC) 20 MG capsule Take 20 mg by mouth daily.    [provider]  ondansetron  (ZOFRAN -ODT) 4 MG disintegrating tablet Take 1 tablet (4 mg total) by mouth every 8 (eight) hours as needed for nausea or vomiting. 09/30/22   Hildegard, Amjad, PA-C  Triprolidine-Pseudoephedrine  (ANTIHISTAMINE DECONGESTANT PO) Take by mouth.    [provider]      Allergies    Iodine-131, Nitrofurantoin macrocrystal, and Ivp dye [iodinated contrast media]    Review of Systems   Review of Systems  Gastrointestinal:  Positive for abdominal pain.  All other systems reviewed and are negative.   Physical Exam Updated Vital Signs BP (!) 130/90   Pulse 90   Temp 98 F (36.7 C)   Resp 19   Ht 5' 6 (1.676 m)   Wt 90.7 kg   SpO2 96%   BMI 32.28 kg/m  Physical Exam Vitals and nursing note reviewed.  Constitutional:      Appearance: She is well-developed.  HENT:     Head: Normocephalic and atraumatic.  Cardiovascular:     Rate and Rhythm: Normal rate and regular rhythm.     Heart sounds: No murmur heard. Pulmonary:     Effort: Pulmonary effort is normal. No respiratory distress.     Breath sounds: Normal breath sounds.  Abdominal:     Palpations: Abdomen is soft.     Tenderness: There is no guarding or rebound.  Comments: Mild to moderate generalized abdominal pain  Musculoskeletal:        General: No tenderness.  Skin:    General: Skin is warm and dry.  Neurological:     Mental Status: She is alert and oriented to person, place, and time.  Psychiatric:        Behavior: Behavior normal.     ED Results / Procedures / Treatments   Labs (all labs ordered are listed, but only abnormal results are displayed) Labs Reviewed  COMPREHENSIVE METABOLIC PANEL - Abnormal; Notable for the following components:      Result Value   Glucose, Bld 107 (*)    AST 53 (*)    ALT 78 (*)    Total Bilirubin 1.4 (*)    All  other components within normal limits  CBC - Abnormal; Notable for the following components:   RBC 5.55 (*)    Hemoglobin 15.4 (*)    HCT 46.7 (*)    All other components within normal limits  URINALYSIS, ROUTINE W REFLEX MICROSCOPIC - Abnormal; Notable for the following components:   Color, Urine AMBER (*)    Ketones, ur 80 (*)    All other components within normal limits  LIPASE, BLOOD  HCG, SERUM, QUALITATIVE    EKG None  Radiology CT ABDOMEN PELVIS WO CONTRAST Result Date: 01/02/2024 CLINICAL DATA:  Abdominal pain. EXAM: CT ABDOMEN AND PELVIS WITHOUT CONTRAST TECHNIQUE: Multidetector CT imaging of the abdomen and pelvis was performed following the standard protocol without IV contrast. RADIATION DOSE REDUCTION: This exam was performed according to the departmental dose-optimization program which includes automated exposure control, adjustment of the mA and/or kV according to patient size and/or use of iterative reconstruction technique. COMPARISON:  12/05/2023 FINDINGS: Lower chest: No acute abnormality Hepatobiliary: No focal liver abnormality is seen. Status post cholecystectomy. No biliary dilatation. Pancreas: No focal abnormality or ductal dilatation. Spleen: No focal abnormality.  Normal size. Adrenals/Urinary Tract: No adrenal abnormality. No focal renal abnormality. No stones or hydronephrosis. Urinary bladder is unremarkable. Stomach/Bowel: Colonic diverticulosis. No active diverticulitis. Normal appendix. Stomach and small bowel decompressed, unremarkable. Vascular/Lymphatic: No evidence of aneurysm or adenopathy. Reproductive: Uterus and adnexa unremarkable.  No mass. Other: No free fluid or free air. Musculoskeletal: No acute bony abnormality. IMPRESSION: No acute findings in the abdomen or pelvis. Colonic diverticulosis.  No active diverticulitis. Electronically Signed   By: Franky Crease M.D.   On: 01/02/2024 00:37    Procedures Procedures    Medications Ordered in  ED Medications  sodium chloride  0.9 % bolus 1,000 mL (0 mLs Intravenous Stopped 01/02/24 0232)  acetaminophen  (TYLENOL ) tablet 650 mg (650 mg Oral Given 01/02/24 0047)    ED Course/ Medical Decision Making/ A&P                                 Medical Decision Making Amount and/or Complexity of Data Reviewed Labs: ordered. Radiology: ordered.  Risk OTC drugs.   Patient here for evaluation of 1 month of progressive upper abdominal pain.  She has mild tenderness on examination without peritoneal findings.  CMP with mild elevation in transaminases.  CBC with elevation in hemoglobin, likely secondary to hemoconcentration.  UA not consistent with UTI.  CT abdomen pelvis was obtained, this was a noncontrasted study due to history of IV contrast allergy.  CT is negative for acute abnormality, in particular no evidence of acute diverticulitis.  Discussed with patient unclear  source of symptoms.  Feel she is stable for discharge home with outpatient GI follow-up as well as return precautions.  Will add Carafate  to her PPI.        Final Clinical Impression(s) / ED Diagnoses Final diagnoses:  Epigastric pain    Rx / DC Orders ED Discharge Orders     None         Griselda Norris, MD 01/02/24 [phone]

## 2025-01-22 NOTE — Progress Notes (Unsigned)
 " Cardiology Office Note   Date:  01/23/2025  ID:  Latoya Schmidt, DOB 1971-08-09, MRN [id number] PCP: [facility] Providers Cardiologist:  None   History of Present Illness Latoya Schmidt is a 54 y.o. female with a past medical history of anxiety, IBS, atypical chest pain, hyperlipidemia, moderate OSA, asthma, and GERD here for follow-up appointment.  Calcium score was done in 2020 with stress echo normal.  Seen by Dr. Bernie in 2021.  More recently, seen by Dr. Okey back in 2024.  Had some left-sided chest pain and episodes were intermittent at that time.  Usually lasted about 20 minutes to 1 hour and was worse if she laid on her left side.  Also has SSCP with strenuous activity.  An echocardiogram was set up and also PFTs were recommended for SOB.  Echocardiogram showed normal heart pump function with normal left ventricular diastolic parameters as well.  Normal valvular function with mild calcification of the aortic valve.  Trivial mitral regurgitation.  Overall, unremarkable.  Today, she presents with persistent headaches and concerns about coronary artery calcifications. She has a strong family hx of CAD with her mother having an MI.   She has persistent unilateral headaches that worsen with stress and can cause eye redness and nausea. She uses Tylenol  every six hours. She feels her blood pressure rises during these episodes. Peak BP has been 145/90s.   She has high cholesterol and a strong family history of coronary artery disease with myocardial infarctions. She is worried about coronary calcification and recalls a CT cardiac calcium score in 2020 that was zero.  She occasionally smokes delta-9 marijuana at night. After use she had a panic attack with chest pain, sweaty palms, and a blood pressure of 155/100, leading to a 911 call. She attributes this episode to marijuana. This was an isolated event.   She has anxiety and stopped  Effexor due to severe side effects. She notes neck tension and perceives increased blood pressure during stress and headaches.  Recent blood pressure readings include 110/80, 120/80, 130/90, and up to 145/90 during headaches.  She had gestational diabetes and reports frequent abnormal blood sugars. She wants to lose weight and has considered Wegovy or Ozempic but is cautious about side effects.  She has moderate sleep apnea treated with CPAP, which helps when used consistently.  She wants to lose weight. She weighs 193 pounds and acknowledges needing more exercise and improved diet to support cardiovascular health  Reports no shortness of breath nor dyspnea on exertion. No edema, orthopnea, PND. Reports no palpitations.   Discussed the use of AI scribe software for clinical note transcription with the patient, who gave verbal consent to proceed.  ROS: pertinent ROS in HPI  Studies Reviewed     Echocardiogram 06/07/2023 IMPRESSIONS     1. Left ventricular ejection fraction, by estimation, is 60 to 65%. The  left ventricle has normal function. The left ventricle has no regional  wall motion abnormalities. Left ventricular diastolic parameters were  normal.   2. Right ventricular systolic function is normal. The right ventricular  size is normal. There is normal pulmonary artery systolic pressure.   3. The mitral valve is normal in structure. Trivial mitral valve  regurgitation. No evidence of mitral stenosis.   4. The aortic valve is normal in structure. There is mild calcification  of the aortic valve. Aortic valve regurgitation is not visualized. No  aortic stenosis is present.   5. The  inferior vena cava is normal in size with greater than 50%  respiratory variability, suggesting right atrial pressure of 3 mmHg.       Physical Exam VS:  BP 110/80   Pulse 80   Ht 5' 6 (1.676 m)   Wt 193 lb (87.5 kg)   SpO2 95%   BMI 31.15 kg/m        Wt Readings from Last 3 Encounters:   01/23/25 193 lb (87.5 kg)  01/01/24 200 lb (90.7 kg)  12/05/23 198 lb (89.8 kg)    GEN: Well nourished, well developed in no acute distress NECK: No JVD; No carotid bruits CARDIAC: RRR, no murmurs, rubs, gallops RESPIRATORY:  Clear to auscultation without rales, wheezing or rhonchi  ABDOMEN: Soft, non-tender, non-distended EXTREMITIES:  No edema; No deformity   ASSESSMENT AND PLAN  Migraine headache Chronic migraines exacerbated by stress, affecting daily activities. Family history noted. Topiramate discussed for prevention and anxiety relief. - Referred to primary care for topiramate prescription for migraine prevention.  Atypical chest pain with cardiovascular risk assessment Intermittent atypical chest pain with family history of coronary artery disease. Previous CT cardiac scoring in 2020 showed a calcium score of zero. Recent echocardiogram in summer 2024 was normal. Differential includes cardiac and anxiety-related causes. CT coronary calcium score recommended. - Ordered CT coronary calcium score to assess for coronary artery calcifications.  Hypertension Elevated blood pressure readings, with current reading at 110/80. Hypertension may contribute to headaches. Discussed impact of headaches and anxiety on blood pressure. - Monitor blood pressure regularly, especially during severe headaches. - Will consider antihypertensive medication if blood pressure remains elevated.  Hyperlipidemia -will order updated lipid panel -FH of CAD  Obesity Current weight 193 lbs. Discussed benefits of weight loss on cardiovascular health and headache management. Consideration of weight loss medications pending A1c results. Discussed risks and benefits of medications, emphasizing diet and exercise. - Ordered hemoglobin A1c to assess glucose control. - Referred to clinical pharmacist for weight loss medication consultation. - Encouraged lifestyle modifications including diet and  exercise.  Anxiety disorder, untreated Anxiety potentially exacerbated by chest pain and headaches. Previous treatment with Effexor not well tolerated. Discussed potential benefits of topiramate for anxiety and her migraines. Consideration of SSRI if cardiac causes are ruled out. - Consider SSRI for anxiety management if cardiac causes are ruled out.     Dispo: She can return in 6 months with MD  Signed, Orren LOISE Fabry, PA-C   "

## 2025-01-23 ENCOUNTER — Encounter: Payer: Self-pay | Admitting: Physician Assistant

## 2025-01-23 ENCOUNTER — Ambulatory Visit: Attending: Cardiology | Admitting: Physician Assistant

## 2025-01-23 VITALS — BP 110/80 | HR 80 | Ht 66.0 in | Wt 193.0 lb

## 2025-01-23 DIAGNOSIS — I10 Essential (primary) hypertension: Secondary | ICD-10-CM | POA: Diagnosis not present

## 2025-01-23 DIAGNOSIS — I5020 Unspecified systolic (congestive) heart failure: Secondary | ICD-10-CM | POA: Diagnosis not present

## 2025-01-23 DIAGNOSIS — R0609 Other forms of dyspnea: Secondary | ICD-10-CM

## 2025-01-23 DIAGNOSIS — E785 Hyperlipidemia, unspecified: Secondary | ICD-10-CM

## 2025-01-23 DIAGNOSIS — J45909 Unspecified asthma, uncomplicated: Secondary | ICD-10-CM

## 2025-01-23 LAB — HEMOGLOBIN A1C
Est. average glucose Bld gHb Est-mCnc: 117 mg/dL
Hgb A1c MFr Bld: 5.7 % — ABNORMAL HIGH (ref 4.8–5.6)

## 2025-01-23 NOTE — Patient Instructions (Addendum)
 Medication Instructions:  Your physician recommends that you continue on your current medications as directed. Please refer to the Current Medication list given to you today. *If you need a refill on your cardiac medications before your next appointment, please call your pharmacy*  Lab Work: [facility] AT YOUR CONVENIENCE- FASTING LIPIDS If you have labs (blood work) drawn today and your tests are completely normal, you will receive your results only by: MyChart Message (if you have MyChart) OR A paper copy in the mail If you have any lab test that is abnormal or we need to change your treatment, we will call you to review the results.  Testing/Procedures: Your provider would like for you to have a Calcium Score CT. This test is painless. This is a non-contrast CT of the heat to look for calcified lesions in the coronary arteries. The cost of this is test cost $99 out of pocket and is not submitted to your insurance. The test can be performed at our [facility] Location in [place].  You can get the test scheduled in our office or you can call [phone].  Then press option 4, Then press option 2 Finally press option 2 and get your test scheduled.  Follow-Up: At [facility], you and your health needs are our priority.  As part of our continuing mission to provide you with exceptional heart care, our providers are all part of one team.  This team includes your primary Cardiologist (physician) and Advanced Practice Providers or APPs (Physician Assistants and Nurse Practitioners) who all work together to provide you with the care you need, when you need it.  Your next appointment:   6 month(s)  Provider:   Lamar Fitch, MD  We recommend signing up for the patient portal called MyChart.  Sign up information is provided on this After Visit Summary.  MyChart is used to connect with patients for Virtual Visits (Telemedicine).  Patients are able to view lab/test  results, encounter notes, upcoming appointments, etc.  Non-urgent messages can be sent to your provider as well.   To learn more about what you can do with MyChart, go to [email or web address].   Other Instructions You have been referred to Pharm-D (weight Loss)

## 2025-01-24 ENCOUNTER — Ambulatory Visit: Payer: Self-pay | Admitting: Physician Assistant

## 2025-01-24 ENCOUNTER — Telehealth: Payer: Self-pay

## 2025-01-24 ENCOUNTER — Ambulatory Visit: Attending: Cardiovascular Disease

## 2025-01-24 VITALS — Ht 66.0 in | Wt 193.2 lb

## 2025-01-24 DIAGNOSIS — E669 Obesity, unspecified: Secondary | ICD-10-CM

## 2025-01-24 NOTE — Telephone Encounter (Signed)
 Noted thank you

## 2025-01-24 NOTE — Patient Instructions (Addendum)
 Latoya Schmidt Latoya Schmidt, Pharm.D, CPP [place] Latoya Schmidt. [facility] [address], [place], [place] [address] Phone: [phone]; Fax: [phone]    GLP-1 Receptor Agonist Counseling Points This medication reduces your appetite and may make you feel fuller longer.  Stop eating when your body tells you that you are full. This will likely happen sooner than you are used to. Fried/greasy food and sweets may upset your stomach - minimize these as much as possible. Store your medication in the fridge until you are ready to use it. Inject your medication in the fatty tissue of your lower abdominal area (2 inches away from belly button) or upper outer thigh. Rotate injection sites. Common side effects include: nausea, diarrhea/constipation, and heartburn, and are more likely to occur if you overeat. Stop your injection for 7 days prior to surgical procedures requiring anesthesia.  Dosing schedule:  We will touch base with you monthly over the phone. The medication can be increased in monthly intervals depending on tolerability and efficacy.  Tips for success: Write down the reasons why you want to lose weight and post it in a place where you'll see it often.  Start small and work your way up. Keep in mind that it takes time to achieve goals, and small steps add up.  Any additional movements help to burn calories. Taking the stairs rather than the elevator and parking at the far end of your parking lot are easy ways to start. Brisk walking for at least 30 minutes 4 or more days of the week is an excellent goal to work toward  Understanding what it means to feel full: Did you know that it can take 15 minutes or more for your brain to receive the message that you've eaten? That means that, if you eat less food, but consume it slower, you may still feel satisfied.  Eating a lot of fruits and vegetables can also help you feel fuller.  Eat off of smaller plates so  that moderate portions don't seem too small  Tips for living a healthier life     Building a Healthy and Balanced Diet Make most of your meal vegetables and fruits -  of your plate. Aim for color and variety, and remember that potatoes dont count as vegetables on the Healthy Eating Plate because of their negative impact on blood sugar.  Go for whole grains -  of your plate. Whole and intact grains--whole wheat, barley, wheat berries, quinoa, oats, brown rice, and foods made with them, such as whole wheat pasta--have a milder effect on blood sugar and insulin than Latoya Schmidt bread, Latoya Schmidt rice, and other refined grains.  Protein power -  of your plate. Fish, poultry, beans, and nuts are all healthy, versatile protein sources--they can be mixed into salads, and pair well with vegetables on a plate. Limit red meat, and avoid processed meats such as bacon and sausage.  Healthy plant oils - in moderation. Choose healthy vegetable oils like olive, canola, soy, corn, sunflower, peanut, and others, and avoid partially hydrogenated oils, which contain unhealthy trans fats. Remember that low-fat does not mean healthy.  Drink water, coffee, or tea. Skip sugary drinks, limit milk and dairy products to one to two servings per day, and limit juice to a small glass per day.  Stay active. The red figure running across the Healthy Eating Plates placemat is a reminder that staying active is also important in weight control.  The main message of  the Healthy Eating Plate is to focus on diet quality:  The type of carbohydrate in the diet is more important than the amount of carbohydrate in the diet, because some sources of carbohydrate--like vegetables (other than potatoes), fruits, whole grains, and beans--are healthier than others. The Healthy Eating Plate also advises consumers to avoid sugary beverages, a major source of calories--usually with little nutritional value--in the American diet. The Healthy  Eating Plate encourages consumers to use healthy oils, and it does not set a maximum on the percentage of calories people should get each day from healthy sources of fat. In this way, the Healthy Eating Plate recommends the opposite of the low-fat message promoted for decades by the USDA.  cuetune.com.ee  SUGAR  Sugar is a huge problem in the modern day diet. Sugar is a big contributor to heart disease, diabetes, high triglyceride levels, fatty liver disease and obesity. Sugar is hidden in almost all packaged foods/beverages. Added sugar is extra sugar that is added beyond what is naturally found and has no nutritional benefit for your body. The American Heart Association recommends limiting added sugars to no more than 25g for women and 36 grams for men per day. There are many names for sugar including maltose, sucrose (names ending in ose), high fructose corn syrup, molasses, cane sugar, corn sweetener, raw sugar, syrup, honey or fruit juice concentrate.   One of the best ways to limit your added sugars is to stop drinking sweetened beverages such as soda, sweet tea, and fruit juice.  There is 65g of added sugars in one 20oz bottle of Coke! That is equal to 7.5 donuts.   Pay attention and read all nutrition facts labels. Below is an examples of a nutrition facts label. The #1 is showing you the total sugars where the # 2 is showing you the added sugars. This one serving has almost the max amount of added sugars per day!   EXERCISE  Exercise is good. Weve all heard that. In an ideal world, we would all have time and resources to get plenty of it. When you are active, your heart pumps more efficiently and you will feel better.  Multiple studies show that even walking regularly has benefits that include living a longer life. The American Heart Association recommends 150 minutes per week of exercise (30 minutes per day most days of the week). You  can do this in any increment you wish. Nine or more 10-minute walks count. So does an hour-long exercise class. Break the time apart into what will work in your life. Some of the best things you can do include walking briskly, jogging, cycling or swimming laps. Not everyone is ready to exercise. Sometimes we need to start with just getting active. Here are some easy ways to be more active throughout the day:  Take the stairs instead of the elevator  Go for a 10-15 minute walk during your lunch break (find a friend to make it more enjoyable)  When shopping, park at the back of the parking lot  If you take public transportation, get off one stop early and walk the extra distance  Pace around while making phone calls  Check with your doctor if you arent sure what your limitations may be. Always remember to drink plenty of water when doing any type of exercise. Dont feel like a failure if youre not getting the 90-150 minutes per week. If you started by being a couch potato, then just a 10-minute walk each day  is a huge improvement. Start with little victories and work your way up.   HEALTHY EATING TIPS              Plan ahead: make a menu of the meals for a week then create a grocery list to go with that menu. Consider meals that easily stretch into a night of leftovers, such as stews or casseroles. Or consider making two of your favorite meal and put one in the freezer for another night. Try a night or two each week that is meatless or no cook such as salads. When you get home from the grocery store wash and prepare your vegetables and fruits. Then when you need them they are ready to go.   Tips for going to the grocery store:  Buy store or generic brands  Check the weekly ad from your store on-line or in their in-store flyer  Look at the unit price on the shelf tag to compare/contrast the costs of different items  Buy fruits/vegetables in season  Carrots, bananas and apples are low-cost,  naturally healthy items  If meats or frozen vegetables are on sale, buy some extras and put in your freezer  Limit buying prepared or ready to eat items, even if they are pre-made salads or fruit snacks  Do not shop when youre hungry  Foods at eye level tend to be more expensive. Look on the high and low shelves for deals.  Consider shopping at the farmers market for fresh foods in season.  Avoid the cookie and chip aisles (these are expensive, high in calories and low in nutritional value). Shop on the outside of the grocery store.  Healthy food preparations:  If you cant get lean hamburger, be sure to drain the fat when cooking  Steam, saut (in olive oil), grill or bake foods  Experiment with different seasonings to avoid adding salt to your foods. Kosher salt, sea salt and Himalayan salt are all still salt and should be avoided. Try seasoning food with onion, garlic, thyme, rosemary, basil ect. Onion powder or garlic powder is ok. Avoid if it says salt (ie garlic salt).

## 2025-01-24 NOTE — Telephone Encounter (Signed)
 Pt called back. She states she spoke to her insurance and they will cover Mounjaro, no PA needed, the copay is $50. She also states she uses [facility]   [id number] - [place], [place] - [address] AT [facility] [address] Phone: [phone]  Fax: [phone]

## 2025-01-24 NOTE — Progress Notes (Signed)
 Patient ID: Latoya Schmidt                 DOB: [birth year]                    MRN: [id number]     HPI: Latoya Schmidt is a 54 y.o. female patient referred to pharmacy clinic by Orren Fabry, PA to initiate GLP1-RA therapy. PMH is significant for moderate OSA on CPAP, HTN, atypical chest pain, HLD, anxiety, migraines, and obesity. Most recent BMI 31.18 kg/m . Most recent A1c 5.7 on 12/2024.  The patient presents today to discuss weight-loss medication options. She has a history of moderate obstructive sleep apnea and reports consistent nightly CPAP use. She states she underwent a sleep study approximately 1.5 years ago and will upload the results via MyChart, as they are not currently available in the chart.  She reports difficulty losing weight and experiencing headaches, and she expresses a goal of losing 30 pounds, believing this will significantly improve her well-being. She denies pregnancy, any personal or family history of medullary thyroid carcinoma (MTC) or multiple endocrine neoplasia type 2 (MEN-2).   She has a past history of IBS-constipation managed by Gastroenterology. She denies any history of pancreatitis, and a prior CT of the abdomen and pelvis confirmed no evidence of chronic pancreatitis. She notes that she was prescribed Creon in the past due to pancreatic insufficiency but she no longer takes anymore.   Explained that pancreatic insufficiency is not the same as pancreatitis and itself is not a contraindication. Rather discussed importance of monitoring for signs/symptoms of acute pancreatitis while on therapy (e.g abdominal pain, n/v)  We discussed side effects of GLP-1RA therapy and possibility of constipation considering hx of IBS. She states that she takes Miralax when she has bouts of constipation and is managed well.  Current weight and BMI: 193.2 lbs and 31.18 kg/m  Goal weight: ~ 30 pound weight loss (163 lbs) Current meds that affect weight: none  Diet:   Breakfast: Pop-Tart or a [employer] biscuit Lunch/Dinner: Microwave meals, pasta, corn, and carrots Beverages: Sweet tea and water  Exercise: none  Family History: Patient has a strong family hx of CAD with MI  Social History: Alcohol: occasionally wine on weekends Smoking: occasionally THC  Labs: Lab Results  Component Value Date   HGBA1C 5.7 (H) 01/23/2025    Wt Readings from Last 1 Encounters:  01/23/25 193 lb (87.5 kg)    BP Readings from Last 1 Encounters:  01/23/25 110/80   Pulse Readings from Last 1 Encounters:  01/23/25 80    No results found for: CHOL, TRIG, HDL, CHOLHDL, VLDL, LDLCALC, LDLDIRECT  Past Medical History:  Diagnosis Date   Anxiety    Asthma    IBS (irritable bowel syndrome)    Ovarian cyst     Medications Ordered Prior to Encounter[1]  Allergies[2]   Assessment/Plan:  1. Weight loss - Patient has not met goal of at least 5% of body weight loss with comprehensive lifestyle modifications alone in the past 3-6 months. Pharmacotherapy is appropriate to pursue as augmentation. Will start GLP-1RA, preferably Zepbound due to hx of moderate OSA. Confirmed patient not pregnant and no personal or family history of medullary thyroid carcinoma (MTC) or Multiple Endocrine Neoplasia syndrome type 2 (MEN 2). Injection technique reviewed at today's visit.  Advised patient on common side effects including nausea, diarrhea, dyspepsia, decreased appetite, and fatigue. Counseled patient on reducing meal size and how to titrate medication to minimize side  effects. Counseled patient to call if intolerable side effects or if experiencing dehydration, abdominal pain, or dizziness. Along with pharmacotherapy, the patient will follow dietary modifications and aim for at least 150 minutes of moderate-intensity exercise per week, plus resistance training twice a week (as recommended by the American Heart Association). This resistance training--such as  weightlifting, bodyweight exercises, or using resistance bands, adapted to the patients ability--will help prevent muscle loss.  Follow up in 1-2 days regarding coverage of GLP-1RA . If therapy is initiated, phone follow-ups will be conducted every 4 weeks for dose titration until the patient reaches the effective therapeutic dose and target weight.  We also discussed self pay options in the event insurance does not cover  [place] Jared E. Peightyn Roberson, Pharm.D, CPP [place] Elspeth BIRCH. [facility] [address] 5[address], [place], [place] [address] Phone: [phone]; Fax: [phone]      [1]  Current Outpatient Medications on File Prior to Visit  Medication Sig Dispense Refill   albuterol  (PROVENTIL  HFA;VENTOLIN  HFA) 108 (90 Base) MCG/ACT inhaler Inhale 2 puffs into the lungs every 6 (six) hours as needed for wheezing or shortness of breath.     CREON 36000-114000 units CPEP capsule Take 36,000 Units by mouth 3 (three) times daily before meals.     cyclobenzaprine (FLEXERIL) 5 MG tablet Take 5 mg by mouth 2 (two) times daily as needed for muscle spasms.     montelukast (SINGULAIR) 10 MG tablet Take 10 mg by mouth daily.     Multiple Vitamin (MULITIVITAMIN WITH MINERALS) TABS Take 1 tablet by mouth daily.     Omega-3 Fatty Acids (FISH OIL PO) Take by mouth. (Patient taking differently: Take 1 capsule by mouth daily.)     omeprazole  (PRILOSEC) 20 MG capsule Take 20 mg by mouth daily.     No current facility-administered medications on file prior to visit.  [2]  Allergies Allergen Reactions   Iodine-131 Rash    Hives   Nitrofurantoin Macrocrystal Rash    rash   Ivp Dye [Iodinated Contrast Media] Hives    unknown

## 2025-01-25 ENCOUNTER — Telehealth: Payer: Self-pay | Admitting: Pharmacy Technician

## 2025-01-25 ENCOUNTER — Other Ambulatory Visit (HOSPITAL_COMMUNITY): Payer: Self-pay

## 2025-01-25 NOTE — Telephone Encounter (Signed)
 Ran test claim for mounjaro. For a 28 day supply and the co-pay is 25.00 but that is on a transition fill. So she will need a prior authorization with a diabetes diagnosis code to get it again    Pharmacy Patient Advocate Encounter   Received notification from Pt Calls Messages that prior authorization for zepbound is required/requested.   Insurance verification completed.   The patient is insured through [employer].   Per test claim: PA required; PA started via CoverMyMeds. KEY [id number] . Waiting for clinical questions to populate.

## 2025-01-25 NOTE — Telephone Encounter (Signed)
 Pt c/o medication issue:  1. Name of Medication:   Mounjaro  2. How are you currently taking this medication (dosage and times per day)?   3. Are you having a reaction (difficulty breathing--STAT)?   4. What is your medication issue?   Patient stated she will need a prescription sent in for this medication to [id number] - [place], [place] - [address] AT [address].

## 2025-01-28 NOTE — Telephone Encounter (Signed)
 Pharmacy Patient Advocate Encounter   Received notification from Pt Calls Messages that prior authorization for zepbound is required/requested.   Insurance verification completed.   The patient is insured through [employer].   Per test claim: PA required; PA started via CoverMyMeds. KEY [id number] .

## 2025-01-28 NOTE — Telephone Encounter (Signed)
 Called patient back to discuss we are working on PA for Zepbound because Mounjaro will eventually require PA and DM diagnosis code in the future. NR-LVM requesting call back

## 2025-01-29 ENCOUNTER — Other Ambulatory Visit (HOSPITAL_COMMUNITY): Payer: Self-pay

## 2025-01-29 ENCOUNTER — Telehealth: Payer: Self-pay | Admitting: Pharmacy Technician

## 2025-01-29 NOTE — Telephone Encounter (Signed)
 Pharmacy Patient Advocate Encounter  Received notification from [facility] that Prior Authorization for zepbound has been DENIED.  See denial reason below. No denial letter attached in CMM. Will attach denial letter to Media tab once received.

## 2025-01-29 NOTE — Telephone Encounter (Signed)
 Latoya Schmidt

## 2025-01-30 NOTE — Telephone Encounter (Signed)
 Mychart message sent

## 2025-01-30 NOTE — Telephone Encounter (Signed)
 Simone from [facility] is calling to see if Dr. Ross/ T.Lucien can send her Mounjaro 6 pens for 84 days which will be covered by [employer] for a one time transitional fill. She is going to need a PA after she picks up transitional fill. Below is where PA can be sent along with chart notes, labs backing up sleep apnea diagnosis and/or pre diabetes.   Please advise  [phone] phone # [phone] fax #

## 2025-01-31 NOTE — Telephone Encounter (Signed)
 Waiting on the questions to populate on cmm [id number] submitted with diag of osa and pre-diabetes

## 2025-02-01 ENCOUNTER — Ambulatory Visit (HOSPITAL_COMMUNITY): Admission: RE | Admit: 2025-02-01 | Payer: Self-pay

## 2025-02-01 DIAGNOSIS — I5020 Unspecified systolic (congestive) heart failure: Secondary | ICD-10-CM

## 2025-02-01 DIAGNOSIS — J45909 Unspecified asthma, uncomplicated: Secondary | ICD-10-CM

## 2025-02-01 DIAGNOSIS — R0609 Other forms of dyspnea: Secondary | ICD-10-CM

## 2025-02-01 NOTE — Telephone Encounter (Signed)
 The only question the prior authorization asked was :  Does the patient have a diagnosis of type 2 diabetes mellitus?  She does not so no was selected and no further questions was asked. [id number] was submitted
